# Patient Record
Sex: Male | Born: 1965
Health system: Southern US, Community
[De-identification: ages and names within clinical notes are randomized; demographics above are authoritative.]

## PROBLEM LIST (undated history)

## (undated) DIAGNOSIS — R200 Anesthesia of skin: Secondary | ICD-10-CM

## (undated) DIAGNOSIS — M5126 Other intervertebral disc displacement, lumbar region: Secondary | ICD-10-CM

## (undated) DIAGNOSIS — U071 COVID-19: Secondary | ICD-10-CM

## (undated) DIAGNOSIS — R202 Paresthesia of skin: Secondary | ICD-10-CM

## (undated) DIAGNOSIS — J189 Pneumonia, unspecified organism: Secondary | ICD-10-CM

## (undated) HISTORY — DX: COVID-19: U07.1

## (undated) HISTORY — PX: BACK SURGERY: SHX140

---

## 1999-11-01 ENCOUNTER — Encounter: Admission: RE | Admit: 1999-11-01 | Discharge: 1999-12-13 | Payer: Self-pay | Admitting: *Deleted

## 1999-12-13 ENCOUNTER — Encounter: Admission: RE | Admit: 1999-12-13 | Discharge: 1999-12-13 | Payer: Self-pay | Admitting: *Deleted

## 1999-12-13 ENCOUNTER — Encounter: Payer: Self-pay | Admitting: *Deleted

## 2000-01-10 ENCOUNTER — Ambulatory Visit (HOSPITAL_COMMUNITY): Admission: RE | Admit: 2000-01-10 | Discharge: 2000-01-10 | Payer: Self-pay | Admitting: Neurosurgery

## 2000-01-10 ENCOUNTER — Encounter: Payer: Self-pay | Admitting: Neurosurgery

## 2000-01-24 ENCOUNTER — Encounter: Payer: Self-pay | Admitting: Neurosurgery

## 2000-01-24 ENCOUNTER — Ambulatory Visit (HOSPITAL_COMMUNITY): Admission: RE | Admit: 2000-01-24 | Discharge: 2000-01-24 | Payer: Self-pay | Admitting: Neurosurgery

## 2000-02-07 ENCOUNTER — Ambulatory Visit (HOSPITAL_COMMUNITY): Admission: RE | Admit: 2000-02-07 | Discharge: 2000-02-07 | Payer: Self-pay | Admitting: Neurosurgery

## 2000-02-07 ENCOUNTER — Encounter: Payer: Self-pay | Admitting: Neurosurgery

## 2000-04-02 ENCOUNTER — Encounter: Admission: RE | Admit: 2000-04-02 | Discharge: 2000-05-28 | Payer: Self-pay | Admitting: Neurosurgery

## 2000-12-29 ENCOUNTER — Encounter: Payer: Self-pay | Admitting: Neurosurgery

## 2000-12-29 ENCOUNTER — Ambulatory Visit (HOSPITAL_COMMUNITY): Admission: RE | Admit: 2000-12-29 | Discharge: 2000-12-29 | Payer: Self-pay | Admitting: Neurosurgery

## 2001-01-21 ENCOUNTER — Ambulatory Visit (HOSPITAL_COMMUNITY): Admission: RE | Admit: 2001-01-21 | Discharge: 2001-01-21 | Payer: Self-pay | Admitting: Neurosurgery

## 2001-01-21 ENCOUNTER — Encounter: Payer: Self-pay | Admitting: Neurosurgery

## 2001-02-04 ENCOUNTER — Encounter: Payer: Self-pay | Admitting: Neurosurgery

## 2001-02-04 ENCOUNTER — Ambulatory Visit (HOSPITAL_COMMUNITY): Admission: RE | Admit: 2001-02-04 | Discharge: 2001-02-04 | Payer: Self-pay | Admitting: Neurosurgery

## 2001-05-24 ENCOUNTER — Encounter: Payer: Self-pay | Admitting: Neurosurgery

## 2001-05-24 ENCOUNTER — Inpatient Hospital Stay (HOSPITAL_COMMUNITY): Admission: RE | Admit: 2001-05-24 | Discharge: 2001-05-26 | Payer: Self-pay | Admitting: Neurosurgery

## 2001-10-09 ENCOUNTER — Ambulatory Visit (HOSPITAL_COMMUNITY): Admission: RE | Admit: 2001-10-09 | Discharge: 2001-10-09 | Payer: Self-pay | Admitting: Neurosurgery

## 2001-10-09 ENCOUNTER — Encounter: Payer: Self-pay | Admitting: Neurosurgery

## 2006-08-25 ENCOUNTER — Emergency Department (HOSPITAL_COMMUNITY): Admission: EM | Admit: 2006-08-25 | Discharge: 2006-08-26 | Payer: Self-pay | Admitting: Emergency Medicine

## 2007-04-15 ENCOUNTER — Encounter: Admission: RE | Admit: 2007-04-15 | Discharge: 2007-04-15 | Payer: Self-pay | Admitting: Specialist

## 2007-05-30 ENCOUNTER — Ambulatory Visit: Payer: Self-pay | Admitting: Pulmonary Disease

## 2007-05-30 ENCOUNTER — Inpatient Hospital Stay (HOSPITAL_COMMUNITY): Admission: RE | Admit: 2007-05-30 | Discharge: 2007-06-01 | Payer: Self-pay | Admitting: Specialist

## 2007-06-25 ENCOUNTER — Ambulatory Visit: Payer: Self-pay | Admitting: Pulmonary Disease

## 2007-06-25 LAB — CONVERTED CEMR LAB
Calcium: 9.1 mg/dL (ref 8.4–10.5)
Chloride: 104 meq/L (ref 96–112)
Creatinine, Ser: 1 mg/dL (ref 0.4–1.5)
GFR calc non Af Amer: 87 mL/min
Glucose, Bld: 97 mg/dL (ref 70–99)
Sodium: 136 meq/L (ref 135–145)

## 2007-06-26 ENCOUNTER — Ambulatory Visit: Payer: Self-pay | Admitting: Cardiology

## 2007-08-20 ENCOUNTER — Ambulatory Visit: Payer: Self-pay | Admitting: Pulmonary Disease

## 2007-08-29 ENCOUNTER — Ambulatory Visit: Payer: Self-pay | Admitting: Cardiology

## 2011-04-18 NOTE — Consult Note (Signed)
[name], [name]                ACCOUNT NO.:  [id number]   MEDICAL RECORD NO.:  [id number]          PATIENT TYPE:  INP   LOCATION:  1439                         FACILITY:  [facility]   PHYSICIAN:  Marcellus Scott, MD     DATE OF BIRTH:  10/01/1966   DATE OF CONSULTATION:  05/30/2007  DATE OF DISCHARGE:                                 CONSULTATION   REFERRING PHYSICIAN:  Jene Every, MD.   REASON FOR REFERRAL:  Exertional dyspnea, fevers, abnormal chest x-ray.   CHIEF COMPLAINT:  Back pain, sweats, dyspnea on exertion.   HISTORY OF PRESENT ILLNESS:  The patient is a pleasant, 45 year old  Caucasian male patient with no known past medical history who was  admitted electively yesterday and had a bilateral lateral recess  decompression, foraminotomy L3-4, discectomy L3-4 under general  anesthesia on 05/29/07. Last night the patient spiked a high-grade  temperature of 103.1 per records. This morning the patient has been  having a low-grade temperature, sweating, dyspnea on walking/exertion.  The patient, however, denies any coughing, chest tightness, chest pain,  wheezing, postnasal drip.   PAST MEDICAL HISTORY:  None.   PSYCHIATRIC HISTORY:  None.   PAST SURGICAL HISTORY:  History of back surgery 5 years ago.   ALLERGIES:  PENICILLINS AND VICODIN.   <HOME MEDS/>  1. Darvocet.  2. Flexeril.   FAMILY HISTORY:  Noncontributory.   SOCIAL HISTORY:  The patient is married and has a child. He is a  IT sales professional. Has a 20-pack-year smoking history with no history of  alcohol abuse or drug abuse.   REVIEW OF SYSTEMS:  Headache since 2 days, even prior to the surgery,  some swollen glands in the neck and some throat discomfort. The patient  has consumed diet and tolerated. The patient is passing flatus but has  not passed any stools.   PHYSICAL EXAMINATION:  GENERAL APPEARANCE: The patient is a moderately-  built and nourished male patient, no obvious distress.  VITAL SIGNS;  Temperature is 99.2 degrees Fahrenheit, pulse of 80 per  minute regular, respirations 16 per minute, blood pressure of 95/56,  saturating at 93% on room air.  HEENT: Normocephalic, atraumatic. Pupils equally reacting to light and  accommodation. Oral cavity with multiple missing teeth and some carious  teeth. No oropharyngeal erythema or thrush.  NECK: Without JVD, carotid bruit, lymphadenopathy or goiter. Supple.  RESPIRATORY: With no increased work of breathing. Decreased breath  sounds in the right base but no crackles. There are scattered  bilaterally a few rhonchi but no wheezes.  CARDIOVASCULAR: First and second heart sounds heard, no third or fourth  heart sounds, no murmurs, rubs, gallops or clicks.  ABDOMEN: The abdomen is nondistended, nontender. No organomegaly or  mass. Bowel sounds are normally present.  CENTRAL NERVOUS SYSTEM: The patient is awake, alert, oriented x3 with no  focal neurological deficits.  EXTREMITIES: With no clubbing, cyanosis or edema. Peripheral pulses are  symmetrically felt. The patient has stockings on both lower extremities.  SKIN: The skin is without rashes.  MUSCULOSKELETAL: Dressing over the lumbar surgical site which is clean,  dry and intact.   LABORATORY DATA:  CBC with hemoglobin of 12.5, hematocrit of 36.8, white  blood cells of 14.8, platelet count of 462. INR of 0.9. These were  results from 6/25.   Radiology with a chest x-ray done today which has been reported and  viewed by me. Impression is  1. Cardiac enlargement with vascular congestion. Increasing right      effusion, may suggest some fluid overload.  2. Bibasilar atelectasis is unchanged.  3. Right paratracheal density, may represent prominent vessels or      adenopathy.   ASSESSMENT AND PLAN:  1. Dyspnea. Differential diagnoses include questionable fluid overload      with small right pleural effusion versus aspiration pneumonia      versus atelectasis. Will discontinue IV  fluids. Will give a single      dose of IV Lasix after checking b-type natriuretic peptide. Will      place the patient on IV Avelox. Will obtain blood cultures if      patient spikes temperatures greater than 101. Will also use      incentive spirometry aggressively and for nebulizations p.r.n.      Follow up repeat chest x-ray in the morning.  2. Tobacco abuse. For tobacco cessation counseling. The patient not      keen on using the nicotine patch and says he could quit without it.  3. Febrile illness with leukocytosis, questionable for reactionary      versus questionable pneumonia. Will, however, place on Avelox and      follow patient.      Marcellus Scott, MD  Electronically Signed     AH/MEDQ  D:  05/30/2007  T:  05/30/2007  Job:  [id number]   cc:   Jene Every, M.D.  Fax: [phone]

## 2011-04-18 NOTE — Op Note (Signed)
[name], [name]                ACCOUNT NO.:  [id number]   MEDICAL RECORD NO.:  [id number]          PATIENT TYPE:  AMB   LOCATION:  DAY                          [facility]   PHYSICIAN:  Jene Every, M.D.    DATE OF BIRTH:  Aug 02, 1966   DATE OF PROCEDURE:  05/29/2007  DATE OF DISCHARGE:                               OPERATIVE REPORT   PREOPERATIVE DIAGNOSIS:  Spinal stenosis, herniated nucleus pulposus 3-  4.   POSTOPERATIVE DIAGNOSIS:  Spinal stenosis, herniated nucleus pulposus 3-  4.   PROCEDURE PERFORMED:  Bilateral lateral recess decompression,  foraminotomy L3-4, diskectomy L3-4.   ANESTHESIA:  General.   ASSISTANT:  Roma Schanz, P.A.   BRIEF HISTORY:  The patient 45 year old bilateral lower extremity  radiculopathy mainly right over left, mild scoliosis and CT myelogram  indicating lateral recess stenosis multifactorial left greater than  right and a paracentral disk protrusion at L3-4.  Operative intervention  was indicated decompression of 3 and 4 nerve roots bilaterally.  Risks  and benefits discussed including bleeding, infection, damage to  neurovascular  structures, no change in symptoms, worsening symptoms,  need for repeat decompression, fusion the future, anesthetic  complications etc.   The patient in supine position after induction of adequate anesthesia 1  g Kefzol, he was placed prone on the Wilson frame.  All bony prominences  well padded.  Lumbar is prepped, draped in the usual sterile fashion.  Two 18 gauge spinal needles utilized to localize the 3-4 interspace  confirmed with x-ray.  Incision was made through spinous process 3-4  subcutaneous tissue was dissected.  Electrocautery utilized to achieve  hemostasis.  Dorsal lumbar fascia identified divided on either line of  the skin incision preserving the interspinous ligament.  McCullough  retractor was placed.  Confirmatory radiographs obtained with Penfield 4  in the interlaminar  space.  First attention was turned towards the disk  herniated side on the left.  Hemilaminotomy at the caudad edge of 3 was  performed with 2 and 3 mm Kerrison.  Soft bone was noted.  He does have  a history of smoking.  Removed ligamentum flavum and the hemilaminotomy  was performed with the cephalad edge of 4 with a 2 mm Kerrison,  decompressing the lateral recess.  It was fairly hypertrophic ligamentum  flavum and vascular engorged venous plexus all compressing the S4 root.  This cauterized and decompressed.  There was a focal paracentral disk  protrusion.  The neural elements were protected with a D'Errico and  annulotomy performed.  Copious portion of disk material was removed from  the disk space with straight and upbiting pituitary fully decompressing  the 4 root.  We inspected.  No evidence of the disk herniation beneath  thecal sac.  A hockey stick probe passed freely in the foramen of three  and four, no evidence of CSF leaks or active bleeding.  Copiously  irrigated disk space antibiotic irrigation, placed Gelfoam in laminotomy  defect after bone wax placed on the cancellous surfaces.  Attention was  turned toward the right.  With rotation of the spine  due to scoliosis  there was a small interlaminar window.  This was opened with 2 mm  Kerrison.  There was lateral recess stenosis noted here.  The vascular  plexus was cauterized.  To decompress lateral recess, performed a  foraminotomy of L3, decompressed three root out into the foramen.  No  disk herniation on the side was noted.  After the foraminotomy, hockey-  stick probe passed freely over foramen of 3 and 4 underneath thecal sac.  This no residual compression, no CSF leakage or active bleeding.  Wound  copiously irrigated.  Bone wax on the cancellous surfaces.  Thrombin-  soaked Gelfoam in the interlaminar space.  Final x-ray was obtained with  a Penfield four in the interlaminar space  3-4.  Cobra retractors  removed.   Paraspinous muscles inspected with no active bleeding, was  copiously irrigated.  Dorsolumbar fascia reapproximated #1 Vicryl  interrupted figure-of-eight suture.  Subcutaneous tissue reapproximated  2-0 Vicryl simple sutures.  The skin was reapproximated staples.  Wound  was dressed sterilely.  Placed supine on hospital bed, extubated without  difficulty transported to the recovery room in satisfactory condition.   The patient tolerated procedure well, no complications.      Jene Every, M.D.  Electronically Signed     JB/MEDQ  D:  05/29/2007  T:  05/29/2007  Job:  [id number]

## 2011-04-21 NOTE — Op Note (Signed)
[place]. [facility]  Patient:    KOUPER, SPINELLA                       MRN: [id number] Proc. Date: 05/24/01 Adm. Date:  [id number] Disc. Date: [id number] Attending:  Danella Penton                           Operative Report  PREOPERATIVE DIAGNOSIS:   Chronic L5-S1 radiculopathy secondary to spondylosis.  POSTOPERATIVE DIAGNOSIS:  Chronic L5-S1 radiculopathy secondary to spondylosis.  OPERATION:  Right L5 hemilaminectomy.  Foraminotomy L4-L5, L5-S1.  Lysis of adhesions.  Microscope.  SURGEON:  Tanya Nones. Jeral Fruit, M.D.  ASSISTANTMena Goes. Franky Macho, M.D.  INDICATIONS:  The patient has been seen by me on several occasions in my office for almost 18 months complaining of back and bilateral leg pain. Lately the pain is getting worse.  X-rays showed that indeed he has degenerative disk disease at the level of L4-L5 and L5-S1.  There was stenosis.  Surgery was advised.  The patient complained of no pain in the left leg.  The patient knew of the risks such as explained in the history and physical.  DESCRIPTION OF PROCEDURE:   The patient was taken to the operating room and placed on the appropriate monitors.  The back was prepped with Betadine.  A midline incision of L5-S1 was made on the right side.  Muscle was retracted on the right side.  By x-ray, we were at the level of L5-S1.  Then using the drill, we drilled the hemilamina of L5.  We brought the microscope into the area and what we found was calcified yellow ligament with quite a bit of spondylosis and narrowing at the level of the foraminotomy.  The foraminotomy was decompressed showing that the L5-S1 nerve root was encased.  Investigation at the level of L4-L5 showed there was a bulging disk but there was no need to do any diskectomy.  Having completed the decompression, the area was irrigated.  Fentanyl was left in the dural space.  _____ _____ was also cut to cover the dura mater.  The wound  was closed with Vicryl and Steri-Strips. The patient did well. DD:  05/24/01 TD:  05/26/01 Job: [id number]

## 2011-04-21 NOTE — H&P (Signed)
[place]. [facility]  Patient:    ZENON, LEAF                       MRN: [id number] Adm. Date:  [id number] Attending:  Danella Penton                         History and Physical  HISTORY OF PRESENT ILLNESS:  Mr. Vespa is a gentleman who has been seen by me for more than a year.  He came in January 2001, complaining of back pain with radiation down to the right leg.  This patient has tried conservative treatment, but he did not get any better.  We did some x-rays which indeed showed that he has degenerative disk disease, as well as herniated disk at L4-5 with stenosis at the level of 5-1.  The patient declined surgery, but nevertheless he continued to get worse.  He had been seen by several physicians.  He is not any better.  Finally, he decided that since the epidural injection has not helped him and he is getting worse that he wants to proceed with surgery.  He denies any pain in the left leg.  PAST MEDICAL HISTORY:  Negative.  ALLERGIES:  He is not allergic to any medications.  SOCIAL HISTORY:  He drinks socially and he smokes a pack a day.  FAMILY HISTORY:  Unremarkable.  REVIEW OF SYSTEMS:  Negative, except for back and right leg pain.  PHYSICAL EXAMINATION:  HEAD/NOSE/THROAT:  Normal.  NECK:  He is able to flex generalized with no problem.  LUNGS:  Clear.  HEART:  Heart sounds are normal.  EXTREMITIES:  Normal, with normal pulses.  NEUROLOGICAL:  Mental status normal.  Cranial nerves normal.  Sensation is grossly normal to pinprick, light touch, and vibration.  His strength showed that he has some weakness of the thenar muscles in both hands.  Also, in the lower extremities he has a weakened dorsiflexion and plantar flexion.  There is no atrophy or fasciculation.  Reflexes are 1+.  No Babinski.  MUSCULOSKELETAL:  Straight-leg raising of the right side is positive at 60 degrees.  Left side is negative.  He had difficulty  walking on tip-toes and heels.  LABORATORY DATA:  The x-rays showed that, indeed, at the level of L4-5 he has degenerative disk disease with stenosis affecting the 5-1.  CLINICAL IMPRESSION:  Chronic L5-S1 radiculopathy secondary to degenerative disk disease and foraminal stenosis.  RECOMMENDATIONS:  The patient wants to proceed with surgery.  The procedure will be a right L5 hemilaminectomy with foraminotomy to the decompress the L5-S1 nerve root.  Decision about diskectomy will be made during surgery. The patient knows of the risks such as infection, CSF leak, worsening of the pain, paralysis, and need of further surgery which might include fusion. DD:  05/24/01 TD:  05/25/01 Job: [id number]

## 2011-04-21 NOTE — Discharge Summary (Signed)
[place]. [facility]  Patient:    [name], [name]                       MRN: [id number] Adm. Date:  [id number] Disc. Date: [id number] Attending:  Danella Penton                           Discharge Summary  ADMISSION DIAGNOSIS:  Lumbar spondylosis with lumbar radiculopathy.  DISCHARGE DIAGNOSIS:  Lumbar spondylosis with lumbar radiculopathy.  OPERATIONS:  Lumbar hemilaminectomy at L5 and facetectomy at L4-5 and L5-S1 on the right.  CONDITION ON DISCHARGE:  Improved.  HOSPITAL COURSE:  The patient is a 45 year old individual who has had significant back and right lower extremity pain.  He underwent hemilaminectomy on the right side at L4-5 and L5-S1 for spondylitic disease.  Postoperatively, he complained of significant back pain and was slow to mobilize.  He used a PCA pump for the first 24 hours and then was weaned to oral Percocet.  DISCHARGE MEDICATIONS:  He is also using some Valium for muscle spasm.  He will be given a prescription for Percocet, #40 without refills, and Ativan 0.5 mg, #40 without refills, for muscle spasm. DD:  05/26/01 TD:  05/27/01 Job: [id number]

## 2011-04-21 NOTE — Discharge Summary (Signed)
NAMEMARTEZ, Louis                ACCOUNT NO.:  [id number]   MEDICAL RECORD NO.:  [id number]          PATIENT TYPE:  INP   LOCATION:  1612                         FACILITY:  [facility]   PHYSICIAN:  Jene Every, M.D.    DATE OF BIRTH:  03-30-1966   DATE OF ADMISSION:  05/29/2007  DATE OF DISCHARGE:  06/01/2007                               DISCHARGE SUMMARY   ADMISSION DIAGNOSES:  1. Herniated nucleus pulposus, spinal stenosis, L3-4 bilaterally.  2. A 20-pack-plus-year smoker.   DISCHARGE DIAGNOSES:  1. Status post bilateral lumbar decompression, L3-4.  2. Questionable aspiration pneumonia versus lung lesion being      evaluated by pulmonology.  Follow up on an outpatient basis.   HISTORY:  [name] is a 45 year old gentleman who sustained a work-  related injury where he developed low back and bilateral lower extremity  pain.  Patient was initially treated conservatively with medications,  physical therapy as well as injections, but failed conservative  treatment.  Studies do show stenosis as well as a small disk bulge at 3-  4.  It is felt at this point that he would benefit from a bilateral  lumbar decompression.  The risks and benefits of this were discussed  with the patient.  He did elect to proceed.   PROCEDURE NOTE:  Patient was taken to the OR on May 29, 2007 to undergo  bilateral decompression at L3-4.   SURGEON:  Jene Every, M.D.   ASSISTANT:  Roma Schanz, P.A.   ANESTHESIA:  General.   COMPLICATIONS:  None.   CONSULTS:  PT/OT, [facility], critical care team.   LABORATORY DATA:  Preoperative CBC showed hemoglobin 13.4, hematocrit  38.3.  These were followed throughout the hospital course.  Patient did  have an elevation in his white cell count to a highest of 15.9.  At the  time of discharge, it had decreased to a level of 13.4.  Hemoglobin  remained stable.  At discharge, hemoglobin was 12.3, hematocrit 35.5.  Platelets were slightly elevated at 460.   Coagulation studies were done  preoperatively within normal range.  Electrolytes preoperatively and  postoperatively all were within normal range as well.  BNP taken  postoperatively was 54.8.  Blood type is O+.  Respiratory cultures were  taken.  Preliminary studies showed no WBC.  Rare squamous epithelial  cells present.  Rare gram positive rods were noted.  Legionella urine  culture is pending.  AFB culture and smear was taken.  To be re-examined  at six weeks following initial sampling.  EKG showed normal sinus  rhythm.  Normal EKG was noted.  No preoperative chest x-ray was taken;  however, postoperatively, the patient developed a high grade  temperature.  A chest x-ray was ordered which showed streaking opacity  in the lung bases, likely due to atelectasis, although developing an  infection cannot be excluded.  Repeat chest x-ray was taken the  following day down in the radiology department, which showed cardiac  enlargement with vascular congestion, increasing right effusion was  noted with questionable fluid overload.  Bibasilar atelectasis was  unchanged.  There was also noted a right paratracheal density with  possible prominent vessels versus adenopathy.   Chest CT was ordered, rendered abnormal lung finding which showed  elevated right hemidiaphragm with right lower lobe atelectasis.  There  is some air space opacity noted in the posterior right lower lobe.  Pneumonia could not be excluded.  There is a 3.8 cm rounded opacity of  the medial right upper lobe which abuts the mediastinum and contains a  central air/fluid level.  This is suspicious for cavitary pneumonia,  questioned secondary to aspiration.  They recommended a followup CT  following treatment.  There is no evidence __________ obstructing mass.  Also noted mild left lower lobe atelectasis as well.   HOSPITAL COURSE:  Patient was admitted, taken to the OR, and underwent  the above-stated procedure without  difficulty.  He is then transferred  to the PACU and then to the orthopedic floor for postoperative care.  Unfortunately, on postoperative night #1, the patient spiked 103.1  temperature.  The PA was called.  No orders were received.  On  postoperative day #1, the patient was re-evaluated.  He was feeling much  better.  The pain in his back was currently controlled.  He was voiding.  He had not been out of bed.  He denied any chest pain, shortness of  breath, or nausea.  He did note a nonproductive cough.  Temperature in  the a.m. of 99.2.   Portable chest x-ray taken overnight did show bibasilar atelectasis,  questionable pneumonia.   White cell count was slightly elevated at 14.8.  We did repeat a chest x-  ray done in the radiology department at 12 noon.  In the interim, we  encouraged incentive spirometer.  The patient was admitted for further  evaluation.   Following the second chest x-ray, which was significantly worse from the  night before, we did consult [facility], who did place the patient on  empiric Avelox as well as aggressive pulmonary toiletry.  The patient  does have a previous history of tobacco abuse.   On postoperative day #2, the patient orthopedically was doing fairly  well but continued to spike low grade temperatures.  White cell count  remained elevated at 15.9.  Avelox incentive spirometer was encouraged.  [facility] continued to work out the reason for his febrile state as well  as a chest x-ray.  They did consult pulmonology, who evaluated the  patient, at which point they recommended a CT scan.  Dr. Shelle Iron did  evaluate the patient and recommended continued treatment with the Avelox  for the pneumonia for 14 days.  It is questionable if there was an  opacity in the lung, which could be malignant in nature.  He however  recommended no immediate treatment, just to follow up in the office for  a repeat CT/chest x-ray following treatment for the pneumonia.    Otherwise, the patient was doing well.  Orthopedically, he progressed  well with physical therapy.  Fevers did resolve.  He did note  significant improvement with taking the antibiotic.  White cell counts  were slowly trending downward.  Cultures of the sputum were taken and  were pending.   DISPOSITION:  On postoperative day #3, it is felt the patient was stable  to be discharged home.  He is to follow up with Dr. Shelle Iron in  approximately 10-14 days for suture removal.  He is to follow up with  Dr. Shelle Iron in 1-2 weeks for further evaluation  of his lung findings.   ACTIVITY:  He is to progress activity slowly.  Okay to walk up the  stairs.  No lifting for six weeks.  No driving for two weeks.  No  sexually active for 2-4 weeks.   INCISION:  He is to keep this clean and dry.  Change it daily.  It is  okay for him to shower in 72 hours.   DISCHARGE MEDICATIONS:  1. Vitamin C 500 mg daily.  2. Darvocet-N 100 1-2 p.o. q.4-6h. p.r.n. pain.  3. Avelox 400 mg 1 daily x2 weeks.  4. He is also placed on clindamycin 300 mg four times a day.   He was advised to go to the emergency room to have a TB test checked.  We encouraged no smoking.  He is to continue to use his incentive  spirometer several times a day.  If he develops high grade fever or  shortness of breath, he is to present to the emergency room immediately.   CONDITION ON DISCHARGE:  Stable.   FINAL DIAGNOSES:  Status post lumbar decompression, L3-4, with new onset  pneumonia versus pulmonary lesion, which will be followed up by Dr.  Shelle Iron on an outpatient basis.      Roma Schanz, P.A.      Jene Every, M.D.  Electronically Signed    CS/MEDQ  D:  07/05/2007  T:  07/05/2007  Job:  [id number]

## 2011-09-20 LAB — BASIC METABOLIC PANEL
BUN: 6
BUN: 8
CO2: 27
Calcium: 8.5
Calcium: 8.7
Chloride: 107
Creatinine, Ser: 0.88
GFR calc non Af Amer: >60
Glucose, Bld: 101 — ABNORMAL HIGH
Glucose, Bld: 115 — ABNORMAL HIGH
Sodium: 137

## 2011-09-20 LAB — CBC
HCT: 36.8 — ABNORMAL LOW
Hemoglobin: 12.3 — ABNORMAL LOW
Hemoglobin: 12.5 — ABNORMAL LOW
MCHC: 34
MCHC: 34.6
MCV: 89.8
MCV: 90.2
MCV: 90.7
Platelets: 416 — ABNORMAL HIGH
Platelets: 426 — ABNORMAL HIGH
RBC: 3.89 — ABNORMAL LOW
RBC: 3.92 — ABNORMAL LOW
RBC: 4.1 — ABNORMAL LOW
RDW: 11.4 — ABNORMAL LOW
RDW: 11.7
RDW: 12.1
WBC: 15.9 — ABNORMAL HIGH

## 2011-09-20 LAB — AFB CULTURE WITH SMEAR (NOT AT ARMC)
Acid Fast Smear: NONE SEEN
Acid Fast Smear: NONE SEEN

## 2011-09-20 LAB — CULTURE, RESPIRATORY W GRAM STAIN

## 2011-09-20 LAB — LEGIONELLA ANTIGEN, URINE

## 2011-09-20 LAB — HEMOGLOBIN AND HEMATOCRIT, BLOOD: Hemoglobin: 13.4

## 2011-09-20 LAB — PROTIME-INR
INR: 0.9
Prothrombin Time: 12.4

## 2011-09-20 LAB — APTT: aPTT: 32

## 2013-04-19 ENCOUNTER — Emergency Department (HOSPITAL_COMMUNITY)
Admission: EM | Admit: 2013-04-19 | Discharge: 2013-04-19 | Disposition: A | Discharge status: Home / Self Care | Payer: Worker's Compensation | Attending: Emergency Medicine | Admitting: Emergency Medicine

## 2013-04-19 ENCOUNTER — Emergency Department (HOSPITAL_COMMUNITY): Payer: Worker's Compensation

## 2013-04-19 ENCOUNTER — Encounter (HOSPITAL_COMMUNITY): Payer: Self-pay | Admitting: *Deleted

## 2013-04-19 DIAGNOSIS — W108XXA Fall (on) (from) other stairs and steps, initial encounter: Secondary | ICD-10-CM | POA: Insufficient documentation

## 2013-04-19 DIAGNOSIS — S81009A Unspecified open wound, unspecified knee, initial encounter: Secondary | ICD-10-CM | POA: Insufficient documentation

## 2013-04-19 DIAGNOSIS — Y9301 Activity, walking, marching and hiking: Secondary | ICD-10-CM | POA: Insufficient documentation

## 2013-04-19 DIAGNOSIS — W1809XA Striking against other object with subsequent fall, initial encounter: Secondary | ICD-10-CM | POA: Insufficient documentation

## 2013-04-19 DIAGNOSIS — Z880 Allergy status to penicillin: Secondary | ICD-10-CM | POA: Insufficient documentation

## 2013-04-19 DIAGNOSIS — F17200 Nicotine dependence, unspecified, uncomplicated: Secondary | ICD-10-CM | POA: Insufficient documentation

## 2013-04-19 DIAGNOSIS — Y9289 Other specified places as the place of occurrence of the external cause: Secondary | ICD-10-CM | POA: Insufficient documentation

## 2013-04-19 DIAGNOSIS — S82002A Unspecified fracture of left patella, initial encounter for closed fracture: Secondary | ICD-10-CM

## 2013-04-19 DIAGNOSIS — S82009A Unspecified fracture of unspecified patella, initial encounter for closed fracture: Secondary | ICD-10-CM | POA: Insufficient documentation

## 2013-04-19 LAB — POCT I-STAT, CHEM 8
BUN: 10 mg/dL (ref 6–23)
Chloride: 106 mEq/L (ref 96–112)
Creatinine, Ser: 0.9 mg/dL (ref 0.50–1.35)
Glucose, Bld: 105 mg/dL — ABNORMAL HIGH (ref 70–99)
Potassium: 3.9 mEq/L (ref 3.5–5.1)

## 2013-04-19 LAB — CBC WITH DIFFERENTIAL/PLATELET
Eosinophils Absolute: 0.1 10*3/uL (ref 0.0–0.7)
Hemoglobin: 15.3 g/dL (ref 13.0–17.0)
Lymphocytes Relative: 16 % (ref 12–46)
Lymphs Abs: 2.4 10*3/uL (ref 0.7–4.0)
MCH: 31.9 pg (ref 26.0–34.0)
Monocytes Relative: 7 % (ref 3–12)
Neutro Abs: 12 10*3/uL — ABNORMAL HIGH (ref 1.7–7.7)
Neutrophils Relative %: 77 % (ref 43–77)
RBC: 4.79 MIL/uL (ref 4.22–5.81)

## 2013-04-19 MED ORDER — HYDROMORPHONE HCL PF 1 MG/ML IJ SOLN
1.0000 mg | Freq: Once | INTRAMUSCULAR | Status: AC
  Administered 2013-04-19: 1 mg via INTRAVENOUS
  Filled 2013-04-19: qty 1

## 2013-04-19 MED ORDER — HYDROMORPHONE HCL 2 MG PO TABS: 2.0000 mg | ORAL_TABLET | ORAL | Status: DC | PRN

## 2013-04-19 NOTE — ED Provider Notes (Signed)
History     CSN: [id number]  Arrival date & time 04/19/13  1421   First MD Initiated Contact with Patient 04/19/13 1424      Chief Complaint  Patient presents with  . Knee Injury    (Consider location/radiation/quality/duration/timing/severity/associated sxs/prior treatment) The history is provided by the patient.   patient was walking upstairs and fell, striking his left knee on the brick stairs. He states he saw his knee In pieces and out of place. No other injury. No chest pain. No Abdominal pain. He does have an abrasion to the knee. He has not eaten today. He denies medical problems except for previous back surgery.  History reviewed. No pertinent past medical history.  Past Surgical History  Procedure Laterality Date  . Back surgery      No family history on file.  History  Substance Use Topics  . Smoking status: Current Every Day Smoker    Types: Cigarettes  . Smokeless tobacco: Not on file  . Alcohol Use: No      Review of Systems  Constitutional: Negative for chills, diaphoresis and fatigue.  Musculoskeletal: Positive for joint swelling and gait problem. Negative for back pain.  Skin: Positive for wound. Negative for pallor and rash.  Hematological: Negative for adenopathy.    Allergies  Penicillins  Home Medications   Current Outpatient Rx  Name  Route  Sig  Dispense  Refill  . HYDROmorphone (DILAUDID) 2 MG tablet   Oral   Take 1 tablet (2 mg total) by mouth every 4 (four) hours as needed for pain.   20 tablet   0     BP 122/73  Pulse 85  Temp(Src) 98.4 F (36.9 C) (Oral)  Resp 16  SpO2 98%  Physical Exam  Constitutional: He is oriented to person, place, and time. He appears well-developed and well-nourished.  HENT:  Head: Normocephalic.  Eyes: Conjunctivae are normal. Pupils are equal, round, and reactive to light.  Neck: Normal range of motion. Neck supple.  Cardiovascular: Normal rate and regular rhythm.   Pulmonary/Chest: Effort  normal and breath sounds normal.  Abdominal: Soft.  Musculoskeletal: He exhibits tenderness.  2 small puncture wounds to left anterior knee. Palpable patella in multiple pieces. Knee effusion and hematoma. Patient will not straighten leg and upper leg is raised. Neurovascular intact distally.  Neurological: He is alert and oriented to person, place, and time.    ED Course  Procedures (including critical care time)  Labs Reviewed  CBC WITH DIFFERENTIAL - Abnormal; Notable for the following:    WBC 15.5 (*)    Neutro Abs 12.0 (*)    All other components within normal limits  POCT I-STAT, CHEM 8 - Abnormal; Notable for the following:    Glucose, Bld 105 (*)    All other components within normal limits   Dg Chest 2 View  04/19/2013   *RADIOLOGY REPORT*  Clinical Data: Fall with dizziness.  Patellar fracture.  CHEST - 2 VIEW  Comparison: CT 08/29/2007 plain films 05/31/2007.  Findings: Lateral view degraded by patient arm position.  Midline trachea.  Mild cardiomegaly, accentuated by low lung volumes on the frontal. No pleural effusion or pneumothorax. Diffuse peribronchial thickening.  Clear lungs.  IMPRESSION: 1.  No acute cardiopulmonary disease. 2.  Mild peribronchial thickening which may relate to chronic bronchitis or smoking.   Original Report Authenticated By: Jeronimo Greaves, M.D.   Dg Knee Complete 4 Views Left  04/19/2013   *RADIOLOGY REPORT*  Clinical Data: Trauma.  Pain.  LEFT KNEE - COMPLETE 4+ VIEW  Comparison: None.  Findings: There is a comminuted fracture of the patella with mild overlying soft tissue swelling.  A small suprapatellar joint effusion.  Mild medial compartment osteoarthritis.  IMPRESSION: Comminuted patellar fracture with soft tissue swelling and small suprapatellar joint effusion.   Original Report Authenticated By: Jeronimo Greaves, M.D.     1. Patella fracture, left, closed, initial encounter       MDM  Patient with patellar fracture. Appears to be closed. After  discussion with Dr. Magnus Ivan from orthopedic surgery the patient be seen in the office on Monday. He was given a knee immobilizer and crutches. He will require surgery and was given preop labs EKG x-ray here.        Juliet Rude. Rubin Payor, MD 04/19/13 [phone]

## 2013-04-19 NOTE — Progress Notes (Signed)
Orthopedic Tech Progress Note Patient Details:  [name] Apr 22, 1966 [id number]  Ortho Devices Type of Ortho Device: Knee Immobilizer;Crutches Ortho Device/Splint Location: LLE Ortho Device/Splint Interventions: Ordered;Adjustment;Application   [name] 04/19/2013, 4:00 PM

## 2013-04-19 NOTE — ED Notes (Signed)
Pt states that he was walking up brick steps when he fell. Pt states that his patella was in a different location and "3 different pieces". Pt denies LOC. Pt received fentynyl, 30 mg of Toradol, and 4 mg of zofran. Pt had applied ice and EMS has pt in splint

## 2013-04-19 NOTE — ED Notes (Addendum)
Pt states that he was seen recently for sinus infection, pneumonia, vertigo.  Pt states the meds he was taking for that which include an antibiotic, prednisone, and nasal spray he is no longer taking.  Pt also states that his doctor told him his liver enzymes were low.

## 2013-04-23 ENCOUNTER — Other Ambulatory Visit (HOSPITAL_COMMUNITY): Payer: Self-pay | Admitting: Orthopaedic Surgery

## 2013-04-24 ENCOUNTER — Encounter (HOSPITAL_COMMUNITY)
Admission: RE | Admit: 2013-04-24 | Discharge: 2013-04-24 | Disposition: A | Discharge status: Home / Self Care | Payer: Self-pay | Source: Ambulatory Visit | Attending: Orthopaedic Surgery | Admitting: Orthopaedic Surgery

## 2013-04-24 ENCOUNTER — Encounter (HOSPITAL_COMMUNITY): Payer: Self-pay

## 2013-04-24 HISTORY — DX: Anesthesia of skin: R20.0

## 2013-04-24 HISTORY — DX: Pneumonia, unspecified organism: J18.9

## 2013-04-24 HISTORY — DX: Other intervertebral disc displacement, lumbar region: M51.26

## 2013-04-24 HISTORY — DX: Paresthesia of skin: R20.2

## 2013-04-24 LAB — CBC
HCT: 41.9 % (ref 39.0–52.0)
Hemoglobin: 14.8 g/dL (ref 13.0–17.0)
MCV: 90.7 fL (ref 78.0–100.0)
RBC: 4.62 MIL/uL (ref 4.22–5.81)
WBC: 13.5 10*3/uL — ABNORMAL HIGH (ref 4.0–10.5)

## 2013-04-24 NOTE — Progress Notes (Signed)
Pt denies SOB, chest pain, and being under the care of a cardiologist.  

## 2013-04-24 NOTE — Pre-Procedure Instructions (Signed)
[name]  04/24/2013   Your procedure is scheduled on:  Saturday, Apr 26, 2013  Report to [facility] at  5:30 AM.  Call this number if you have problems the morning of surgery: [phone]   Remember:   Do not eat food or drink liquids after midnight.   Take these medicines the morning of surgery with A SIP OF WATER: HYDROmorphone (DILAUDID) 2 MG tablet Stop taking Aspirin, and herbal medications. Do not take any NSAIDs ie: Ibuprofen, Advil, Naproxen or any medication containing Aspirin.             Do not wear jewelry, make-up or nail polish.  Do not wear lotions, powders, or perfumes. You may wear deodorant.  Do not shave 48 hours prior to surgery. Men may shave face and neck.  Do not bring valuables to the hospital.  Contacts, dentures or bridgework may not be worn into surgery.  Leave suitcase in the car. After surgery it may be brought to your room.  For patients admitted to the hospital, checkout time is 11:00 AM the day of discharge.   Patients discharged the day of surgery will not be allowed to drive home.  Name and phone number of your driver:   Special Instructions: Shower using CHG 2 nights before surgery and the night before surgery.  If you shower the day of surgery use CHG.  Use special wash - you have one bottle of CHG for all showers.  You should use approximately 1/3 of the bottle for each shower.   Please read over the following fact sheets that you were given: Pain Booklet, Coughing and Deep Breathing and Surgical Site Infection Prevention

## 2013-04-25 MED ORDER — CLINDAMYCIN PHOSPHATE 900 MG/50ML IV SOLN
900.0000 mg | INTRAVENOUS | Status: AC
  Administered 2013-04-26: 900 mg via INTRAVENOUS
  Filled 2013-04-25: qty 50

## 2013-04-26 ENCOUNTER — Encounter (HOSPITAL_COMMUNITY): Payer: Self-pay | Admitting: *Deleted

## 2013-04-26 ENCOUNTER — Observation Stay (HOSPITAL_COMMUNITY)
Admission: RE | Admit: 2013-04-26 | Discharge: 2013-04-27 | Disposition: A | Discharge status: Home / Self Care | Payer: Self-pay | Source: Ambulatory Visit | Attending: Orthopaedic Surgery | Admitting: Orthopaedic Surgery

## 2013-04-26 ENCOUNTER — Encounter (HOSPITAL_COMMUNITY): Payer: Self-pay | Admitting: Anesthesiology

## 2013-04-26 ENCOUNTER — Encounter (HOSPITAL_COMMUNITY)
Admission: RE | Disposition: A | Discharge status: Home / Self Care | Payer: Self-pay | Source: Ambulatory Visit | Attending: Orthopaedic Surgery

## 2013-04-26 ENCOUNTER — Ambulatory Visit (HOSPITAL_COMMUNITY): Payer: Self-pay | Admitting: Anesthesiology

## 2013-04-26 DIAGNOSIS — S82009A Unspecified fracture of unspecified patella, initial encounter for closed fracture: Principal | ICD-10-CM | POA: Insufficient documentation

## 2013-04-26 DIAGNOSIS — W108XXA Fall (on) (from) other stairs and steps, initial encounter: Secondary | ICD-10-CM | POA: Insufficient documentation

## 2013-04-26 DIAGNOSIS — S82001A Unspecified fracture of right patella, initial encounter for closed fracture: Secondary | ICD-10-CM

## 2013-04-26 HISTORY — PX: ORIF PATELLA: SHX5033

## 2013-04-26 SURGERY — OPEN REDUCTION INTERNAL FIXATION (ORIF) PATELLA
Anesthesia: General | Site: Knee | Laterality: Left | Wound class: Clean

## 2013-04-26 MED ORDER — HYDROMORPHONE HCL PF 1 MG/ML IJ SOLN
INTRAMUSCULAR | Status: AC
Start: 2013-04-26 — End: 2013-04-26
  Filled 2013-04-26: qty 1

## 2013-04-26 MED ORDER — ARTIFICIAL TEARS OP OINT
TOPICAL_OINTMENT | OPHTHALMIC | Status: DC | PRN
  Administered 2013-04-26: 1 via OPHTHALMIC

## 2013-04-26 MED ORDER — FENTANYL CITRATE 0.05 MG/ML IJ SOLN
INTRAMUSCULAR | Status: DC | PRN
  Administered 2013-04-26: 25 ug via INTRAVENOUS
  Administered 2013-04-26 (×2): 50 ug via INTRAVENOUS
  Administered 2013-04-26 (×2): 25 ug via INTRAVENOUS
  Administered 2013-04-26: 50 ug via INTRAVENOUS
  Administered 2013-04-26: 25 ug via INTRAVENOUS
  Administered 2013-04-26: 50 ug via INTRAVENOUS

## 2013-04-26 MED ORDER — POLYETHYLENE GLYCOL 3350 17 G PO PACK: 17.0000 g | PACK | Freq: Every day | ORAL | Status: DC | PRN

## 2013-04-26 MED ORDER — MORPHINE SULFATE 2 MG/ML IJ SOLN
1.0000 mg | INTRAMUSCULAR | Status: DC | PRN
  Administered 2013-04-26 (×2): 1 mg via INTRAVENOUS

## 2013-04-26 MED ORDER — PROPOFOL 10 MG/ML IV BOLUS
INTRAVENOUS | Status: DC | PRN
  Administered 2013-04-26: 200 mg via INTRAVENOUS

## 2013-04-26 MED ORDER — HYDROCODONE-ACETAMINOPHEN 5-325 MG PO TABS: 1.0000 | ORAL_TABLET | ORAL | Status: DC | PRN

## 2013-04-26 MED ORDER — ONDANSETRON HCL 4 MG PO TABS: 4.0000 mg | ORAL_TABLET | Freq: Four times a day (QID) | ORAL | Status: DC | PRN

## 2013-04-26 MED ORDER — KETOROLAC TROMETHAMINE 15 MG/ML IJ SOLN
15.0000 mg | Freq: Four times a day (QID) | INTRAMUSCULAR | Status: DC | PRN
  Administered 2013-04-26: 15 mg via INTRAVENOUS
  Filled 2013-04-26: qty 1

## 2013-04-26 MED ORDER — PROMETHAZINE HCL 25 MG/ML IJ SOLN: 6.2500 mg | INTRAMUSCULAR | Status: DC | PRN

## 2013-04-26 MED ORDER — BISACODYL 5 MG PO TBEC: 5.0000 mg | DELAYED_RELEASE_TABLET | Freq: Every day | ORAL | Status: DC | PRN

## 2013-04-26 MED ORDER — LIDOCAINE HCL (CARDIAC) 20 MG/ML IV SOLN
INTRAVENOUS | Status: DC | PRN
  Administered 2013-04-26: 80 mg via INTRAVENOUS

## 2013-04-26 MED ORDER — DOCUSATE SODIUM 100 MG PO CAPS
100.0000 mg | ORAL_CAPSULE | Freq: Two times a day (BID) | ORAL | Status: DC
  Administered 2013-04-26 – 2013-04-27 (×3): 100 mg via ORAL
  Filled 2013-04-26 (×3): qty 1

## 2013-04-26 MED ORDER — OXYCODONE HCL 5 MG PO TABS
5.0000 mg | ORAL_TABLET | Freq: Once | ORAL | Status: AC | PRN
  Administered 2013-04-26: 5 mg via ORAL

## 2013-04-26 MED ORDER — METOCLOPRAMIDE HCL 10 MG PO TABS: 5.0000 mg | ORAL_TABLET | Freq: Three times a day (TID) | ORAL | Status: DC | PRN

## 2013-04-26 MED ORDER — KETOROLAC TROMETHAMINE 30 MG/ML IJ SOLN
INTRAMUSCULAR | Status: AC
  Filled 2013-04-26: qty 1

## 2013-04-26 MED ORDER — OXYCODONE HCL 5 MG PO TABS
ORAL_TABLET | ORAL | Status: AC
  Filled 2013-04-26: qty 1

## 2013-04-26 MED ORDER — OXYCODONE HCL ER 10 MG PO T12A
20.0000 mg | EXTENDED_RELEASE_TABLET | Freq: Two times a day (BID) | ORAL | Status: DC
  Administered 2013-04-26 – 2013-04-27 (×3): 20 mg via ORAL
  Filled 2013-04-26 (×2): qty 1
  Filled 2013-04-26: qty 2
  Filled 2013-04-26: qty 1

## 2013-04-26 MED ORDER — HYDROMORPHONE HCL PF 1 MG/ML IJ SOLN
INTRAMUSCULAR | Status: AC
  Filled 2013-04-26: qty 1

## 2013-04-26 MED ORDER — METHOCARBAMOL 500 MG PO TABS
500.0000 mg | ORAL_TABLET | Freq: Four times a day (QID) | ORAL | Status: DC | PRN
  Administered 2013-04-26 – 2013-04-27 (×3): 500 mg via ORAL
  Filled 2013-04-26 (×4): qty 1

## 2013-04-26 MED ORDER — HYDROMORPHONE HCL PF 1 MG/ML IJ SOLN
0.2500 mg | INTRAMUSCULAR | Status: DC | PRN
  Administered 2013-04-26 (×4): 0.5 mg via INTRAVENOUS

## 2013-04-26 MED ORDER — OXYCODONE HCL 5 MG/5ML PO SOLN: 5.0000 mg | Freq: Once | ORAL | Status: AC | PRN

## 2013-04-26 MED ORDER — LACTATED RINGERS IV SOLN
INTRAVENOUS | Status: DC | PRN
  Administered 2013-04-26: 07:00:00 via INTRAVENOUS

## 2013-04-26 MED ORDER — ACETAMINOPHEN 10 MG/ML IV SOLN
1000.0000 mg | Freq: Once | INTRAVENOUS | Status: AC
  Administered 2013-04-26: 1000 mg via INTRAVENOUS
  Filled 2013-04-26: qty 100

## 2013-04-26 MED ORDER — ACETAMINOPHEN 10 MG/ML IV SOLN
INTRAVENOUS | Status: AC
  Filled 2013-04-26: qty 100

## 2013-04-26 MED ORDER — SODIUM CHLORIDE 0.9 % IV SOLN
INTRAVENOUS | Status: DC
  Administered 2013-04-26: 15:00:00 via INTRAVENOUS

## 2013-04-26 MED ORDER — CLINDAMYCIN PHOSPHATE 600 MG/50ML IV SOLN
600.0000 mg | Freq: Four times a day (QID) | INTRAVENOUS | Status: AC
  Administered 2013-04-26 – 2013-04-27 (×3): 600 mg via INTRAVENOUS
  Filled 2013-04-26 (×3): qty 50

## 2013-04-26 MED ORDER — 0.9 % SODIUM CHLORIDE (POUR BTL) OPTIME
TOPICAL | Status: DC | PRN
  Administered 2013-04-26: 1000 mL

## 2013-04-26 MED ORDER — ZOLPIDEM TARTRATE 5 MG PO TABS: 5.0000 mg | ORAL_TABLET | Freq: Every evening | ORAL | Status: DC | PRN

## 2013-04-26 MED ORDER — METOCLOPRAMIDE HCL 5 MG/ML IJ SOLN: 5.0000 mg | Freq: Three times a day (TID) | INTRAMUSCULAR | Status: DC | PRN

## 2013-04-26 MED ORDER — DIPHENHYDRAMINE HCL 12.5 MG/5ML PO ELIX: 12.5000 mg | ORAL_SOLUTION | ORAL | Status: DC | PRN

## 2013-04-26 MED ORDER — ONDANSETRON HCL 4 MG/2ML IJ SOLN: 4.0000 mg | Freq: Four times a day (QID) | INTRAMUSCULAR | Status: DC | PRN

## 2013-04-26 MED ORDER — METHOCARBAMOL 100 MG/ML IJ SOLN: 500.0000 mg | Freq: Four times a day (QID) | INTRAVENOUS | Status: DC | PRN

## 2013-04-26 MED ORDER — OXYCODONE HCL 5 MG PO TABS
5.0000 mg | ORAL_TABLET | ORAL | Status: DC | PRN
  Administered 2013-04-26 – 2013-04-27 (×5): 10 mg via ORAL
  Filled 2013-04-26 (×5): qty 2

## 2013-04-26 MED ORDER — ONDANSETRON HCL 4 MG/2ML IJ SOLN
INTRAMUSCULAR | Status: DC | PRN
  Administered 2013-04-26: 4 mg via INTRAVENOUS

## 2013-04-26 MED ORDER — MORPHINE SULFATE 2 MG/ML IJ SOLN
INTRAMUSCULAR | Status: AC
  Filled 2013-04-26: qty 1

## 2013-04-26 SURGICAL SUPPLY — 68 items
BANDAGE ELASTIC 4 VELCRO ST LF (GAUZE/BANDAGES/DRESSINGS) ×2 IMPLANT
BANDAGE ELASTIC 6 VELCRO ST LF (GAUZE/BANDAGES/DRESSINGS) ×2 IMPLANT
BANDAGE ESMARK 6X9 LF (GAUZE/BANDAGES/DRESSINGS) ×1 IMPLANT
BIT DRILL CANN 2.7X625 NONSTRL (BIT) ×1 IMPLANT
BNDG CMPR 9X6 STRL LF SNTH (GAUZE/BANDAGES/DRESSINGS) ×1
BNDG COHESIVE 4X5 TAN STRL (GAUZE/BANDAGES/DRESSINGS) ×1 IMPLANT
BNDG COHESIVE 6X5 TAN STRL LF (GAUZE/BANDAGES/DRESSINGS) ×1 IMPLANT
BNDG ESMARK 6X9 LF (GAUZE/BANDAGES/DRESSINGS) ×2
CANISTER SUCTION 2500CC (MISCELLANEOUS) ×1 IMPLANT
CLEANER TIP ELECTROSURG 2X2 (MISCELLANEOUS) ×1 IMPLANT
CLOTH BEACON ORANGE TIMEOUT ST (SAFETY) ×2 IMPLANT
COVER MAYO STAND STRL (DRAPES) ×2 IMPLANT
CUFF TOURNIQUET SINGLE 34IN LL (TOURNIQUET CUFF) ×1 IMPLANT
CUFF TOURNIQUET SINGLE 44IN (TOURNIQUET CUFF) IMPLANT
DRAPE C-ARM 42X72 X-RAY (DRAPES) ×2 IMPLANT
DRAPE ORTHO SPLIT 77X108 STRL (DRAPES) ×4
DRAPE SURG ORHT 6 SPLT 77X108 (DRAPES) ×2 IMPLANT
DRAPE U-SHAPE 47X51 STRL (DRAPES) ×2 IMPLANT
DRSG PAD ABDOMINAL 8X10 ST (GAUZE/BANDAGES/DRESSINGS) ×2 IMPLANT
DURAPREP 26ML APPLICATOR (WOUND CARE) ×2 IMPLANT
ELECT REM PT RETURN 9FT ADLT (ELECTROSURGICAL) ×2
ELECTRODE REM PT RTRN 9FT ADLT (ELECTROSURGICAL) ×1 IMPLANT
EVACUATOR 1/8 PVC DRAIN (DRAIN) IMPLANT
GAUZE XEROFORM 1X8 LF (GAUZE/BANDAGES/DRESSINGS) ×2 IMPLANT
GLOVE BIO SURGEON STRL SZ7.5 (GLOVE) ×1 IMPLANT
GLOVE BIO SURGEON STRL SZ8 (GLOVE) ×2 IMPLANT
GLOVE BIOGEL PI IND STRL 8 (GLOVE) ×1 IMPLANT
GLOVE BIOGEL PI INDICATOR 8 (GLOVE) ×2
GLOVE ORTHO TXT STRL SZ7.5 (GLOVE) ×1 IMPLANT
GOWN EXTRA PROTECTION XXL 0583 (GOWNS) ×1 IMPLANT
GOWN PREVENTION PLUS LG XLONG (DISPOSABLE) IMPLANT
GOWN PREVENTION PLUS XLARGE (GOWN DISPOSABLE) ×2 IMPLANT
GOWN STRL NON-REIN LRG LVL3 (GOWN DISPOSABLE) ×4 IMPLANT
GUIDEWIRE THREADED 150MM (WIRE) ×3 IMPLANT
IMMOBILIZER KNEE 22 UNIV (SOFTGOODS) ×1 IMPLANT
KIT BASIN OR (CUSTOM PROCEDURE TRAY) ×2 IMPLANT
KIT ROOM TURNOVER OR (KITS) ×2 IMPLANT
MANIFOLD NEPTUNE II (INSTRUMENTS) ×1 IMPLANT
NS IRRIG 1000ML POUR BTL (IV SOLUTION) ×2 IMPLANT
PACK ORTHO EXTREMITY (CUSTOM PROCEDURE TRAY) ×2 IMPLANT
PAD ARMBOARD 7.5X6 YLW CONV (MISCELLANEOUS) ×4 IMPLANT
PAD CAST 4YDX4 CTTN HI CHSV (CAST SUPPLIES) ×1 IMPLANT
PADDING CAST COTTON 4X4 STRL (CAST SUPPLIES) ×2
PADDING CAST COTTON 6X4 STRL (CAST SUPPLIES) ×2 IMPLANT
PASSER SUT SWANSON 36MM LOOP (INSTRUMENTS) ×1 IMPLANT
PENCIL BUTTON HOLSTER BLD 10FT (ELECTRODE) ×2 IMPLANT
SPONGE GAUZE 4X4 12PLY (GAUZE/BANDAGES/DRESSINGS) ×2 IMPLANT
SPONGE LAP 18X18 X RAY DECT (DISPOSABLE) ×2 IMPLANT
STAPLER VISISTAT 35W (STAPLE) ×2 IMPLANT
STOCKINETTE IMPERVIOUS 9X36 MD (GAUZE/BANDAGES/DRESSINGS) ×1 IMPLANT
STOCKINETTE IMPERVIOUS LG (DRAPES) ×2 IMPLANT
SUCTION FRAZIER TIP 10 FR DISP (SUCTIONS) ×2 IMPLANT
SUT ETHIBOND NAB CT1 #1 30IN (SUTURE) ×1 IMPLANT
SUT FIBERWIRE #2 38 T-5 BLUE (SUTURE) ×4
SUT VIC AB 0 CT1 27 (SUTURE) ×2
SUT VIC AB 0 CT1 27XBRD ANBCTR (SUTURE) ×1 IMPLANT
SUT VIC AB 1 CT1 27 (SUTURE) ×2
SUT VIC AB 1 CT1 27XBRD ANBCTR (SUTURE) IMPLANT
SUT VIC AB 2-0 CT1 27 (SUTURE) ×2
SUT VIC AB 2-0 CT1 TAPERPNT 27 (SUTURE) ×1 IMPLANT
SUTURE FIBERWR #2 38 T-5 BLUE (SUTURE) IMPLANT
SYR BULB IRRIGATION 50ML (SYRINGE) ×2 IMPLANT
TOWEL OR 17X24 6PK STRL BLUE (TOWEL DISPOSABLE) ×2 IMPLANT
TOWEL OR 17X26 10 PK STRL BLUE (TOWEL DISPOSABLE) ×2 IMPLANT
TUBE CONNECTING 12X1/4 (SUCTIONS) ×2 IMPLANT
UNDERPAD 30X30 INCONTINENT (UNDERPADS AND DIAPERS) ×2 IMPLANT
WATER STERILE IRR 1000ML POUR (IV SOLUTION) ×1 IMPLANT
YANKAUER SUCT BULB TIP NO VENT (SUCTIONS) ×2 IMPLANT

## 2013-04-26 NOTE — Anesthesia Preprocedure Evaluation (Addendum)
Anesthesia Evaluation  Patient identified by MRN, date of birth, ID band Patient awake    Reviewed: Allergy & Precautions, H&P , NPO status   History of Anesthesia Complications Negative for: history of anesthetic complications  Airway Mallampati: II TM Distance: >3 FB Neck ROM: Full    Dental  (+) Edentulous Upper, Poor Dentition and Dental Advisory Given   Pulmonary pneumonia -, resolved, Current Smoker,  breath sounds clear to auscultation  - decreased breath sounds      Cardiovascular negative cardio ROS  Rhythm:Regular Rate:Normal     Neuro/Psych negative neurological ROS     GI/Hepatic negative GI ROS, Neg liver ROS,   Endo/Other  negative endocrine ROSMorbid obesity  Renal/GU negative Renal ROS     Musculoskeletal   Abdominal (+) + obese,   Peds  Hematology   Anesthesia Other Findings   Reproductive/Obstetrics                         Anesthesia Physical Anesthesia Plan  ASA: II  Anesthesia Plan: General   Post-op Pain Management:    Induction: Intravenous  Airway Management Planned: LMA  Additional Equipment:   Intra-op Plan:   Post-operative Plan: Extubation in OR  Informed Consent: I have reviewed the patients History and Physical, chart, labs and discussed the procedure including the risks, benefits and alternatives for the proposed anesthesia with the patient or authorized representative who has indicated his/her understanding and acceptance.   Dental advisory given  Plan Discussed with: CRNA and Surgeon  Anesthesia Plan Comments:         Anesthesia Quick Evaluation

## 2013-04-26 NOTE — H&P (Signed)
[name] is an 47 y.o. male.   Chief Complaint:   Left knee pain with known patella fracture HPI:   47 yo male who fractured his left patella when he fell directly on to a concrete floor when he tripped going up some stairs.  Was seen in the ER and found to have a comminuted patella fracture.  He now presents for definitive surgical fixation of this fracture.  He understands fully the risks and benefits involved and gives informed consent.  Past Medical History  Diagnosis Date  . Pneumonia     Hx of 2014  . Numbness and tingling of both legs     Hx: of  . Lumbar herniated disc     Hx of    Past Surgical History  Procedure Laterality Date  . Back surgery      Family History  Problem Relation Age of Onset  . Hypertension Father    Social History:  reports that he has been smoking Cigars.  He does not have any smokeless tobacco history on file. He reports that  drinks alcohol. He reports that he does not use illicit drugs.  Allergies:  Allergies  Allergen Reactions  . Penicillins Hives    Medications Prior to Admission  Medication Sig Dispense Refill  . HYDROmorphone (DILAUDID) 2 MG tablet Take 1 tablet (2 mg total) by mouth every 4 (four) hours as needed for pain.  20 tablet  0  . oxyCODONE-acetaminophen (PERCOCET/ROXICET) 5-325 MG per tablet Take 1-2 tablets by mouth every 6 (six) hours as needed for pain (6-8 hours).        Results for orders placed during the hospital encounter of 04/24/13 (from the past 48 hour(s))  CBC     Status: Abnormal   Collection Time    04/24/13  3:38 PM      Result Value Range   WBC 13.5 (*) 4.0 - 10.5 K/uL   RBC 4.62  4.22 - 5.81 MIL/uL   Hemoglobin 14.8  13.0 - 17.0 g/dL   HCT 45.4  09.8 - 11.9 %   MCV 90.7  78.0 - 100.0 fL   MCH 32.0  26.0 - 34.0 pg   MCHC 35.3  30.0 - 36.0 g/dL   RDW 14.7  82.9 - 56.2 %   Platelets 289  150 - 400 K/uL  SURGICAL PCR SCREEN     Status: None   Collection Time    04/24/13  3:39 PM      Result  Value Range   MRSA, PCR NEGATIVE  NEGATIVE   Staphylococcus aureus NEGATIVE  NEGATIVE   Comment:            The Xpert SA Assay (FDA     approved for NASAL specimens     in patients over 20 years of age),     is one component of     a comprehensive surveillance     program.  Test performance has     been validated by [employer] for patients greater     than or equal to 21 year old.     It is not intended     to diagnose infection nor to     guide or monitor treatment.   No results found.  Review of Systems  All other systems reviewed and are negative.    Blood pressure 152/87, pulse 90, temperature 97.2 F (36.2 C), temperature source Oral, resp. rate 20, SpO2 97.00%. Physical Exam  Constitutional: He is oriented to person, place, and time. He appears well-developed and well-nourished.  HENT:  Head: Normocephalic and atraumatic.  Eyes: EOM are normal. Pupils are equal, round, and reactive to light.  Neck: Normal range of motion. Neck supple.  Cardiovascular: Normal rate and regular rhythm.   Respiratory: Effort normal and breath sounds normal.  GI: Soft. Bowel sounds are normal.  Musculoskeletal:       Left knee: He exhibits decreased range of motion, swelling, effusion and ecchymosis. Tenderness found. Patellar tendon tenderness noted.  Neurological: He is alert and oriented to person, place, and time.  Skin: Skin is warm and dry.  Psychiatric: He has a normal mood and affect.     Assessment/Plan Left closed patella fracture in multiple pieces 1) To the OR today for ORIF of his left patella  Tura Roller Y 04/26/2013, 7:27 AM

## 2013-04-26 NOTE — Preoperative (Signed)
Beta Blockers   Reason not to administer Beta Blockers:Not Applicable 

## 2013-04-26 NOTE — Anesthesia Procedure Notes (Signed)
Procedure Name: LMA Insertion Date/Time: 04/26/2013 7:43 AM Performed by: Gayla Medicus Pre-anesthesia Checklist: Patient identified, Timeout performed, Emergency Drugs available, Suction available and Patient being monitored Patient Re-evaluated:Patient Re-evaluated prior to inductionOxygen Delivery Method: Circle system utilized Preoxygenation: Pre-oxygenation with 100% oxygen Intubation Type: IV induction LMA: LMA with gastric port inserted LMA Size: 5.0 Number of attempts: 1 Placement Confirmation: positive ETCO2 and breath sounds checked- equal and bilateral Tube secured with: Tape Dental Injury: Teeth and Oropharynx as per pre-operative assessment

## 2013-04-26 NOTE — Anesthesia Postprocedure Evaluation (Signed)
  Anesthesia Post-op Note  Patient: [name]  Procedure(s) Performed: Procedure(s): Partial catalectomy with advancement of patella tendon (Left)  Patient Location: PACU  Anesthesia Type:General  Level of Consciousness: awake  Airway and Oxygen Therapy: Patient Spontanous Breathing  Post-op Pain: mild  Post-op Assessment: Post-op Vital signs reviewed  Post-op Vital Signs: stable  Complications: No apparent anesthesia complications

## 2013-04-26 NOTE — Brief Op Note (Signed)
04/26/2013  8:32 AM  PATIENT:  [name]  47 y.o. male  PRE-OPERATIVE DIAGNOSIS:  Left patella fracture  POST-OPERATIVE DIAGNOSIS:  Left patella fracture  PROCEDURE:  Procedure(s): Partial catalectomy with advancement of patella tendon (Left)  SURGEON:  Surgeon(s) and Role:    * Kathryne Hitch, MD - Primary  PHYSICIAN ASSISTANT: Rexene Edison, PA-C  ANESTHESIA:   general  EBL:  Total I/O In: 900 [I.V.:900] Out: -   BLOOD ADMINISTERED:none  DRAINS: none   LOCAL MEDICATIONS USED:  NONE  SPECIMEN:  No Specimen  DISPOSITION OF SPECIMEN:  N/A  COUNTS:  YES  TOURNIQUET:  * Missing tourniquet times found for documented tourniquets in log:  100309 *  DICTATION: .Other Dictation: Dictation Number [id number]  PLAN OF CARE: Admit for overnight observation  PATIENT DISPOSITION:  PACU - hemodynamically stable.   Delay start of Pharmacological VTE agent (>24hrs) due to surgical blood loss or risk of bleeding: no

## 2013-04-26 NOTE — Progress Notes (Signed)
Orthopedic Tech Progress Note Patient Details:  KIP CROPP [birth year] [id number]  Patient ID: Luvenia Starch, male   DOB: August 26, 1966, 47 y.o.   MRN: [id number] Brace order completed by Storm Frisk, Diahann Guajardo 04/26/2013, 2:31 PM

## 2013-04-26 NOTE — Transfer of Care (Signed)
Immediate Anesthesia Transfer of Care Note  Patient: [name]  Procedure(s) Performed: Procedure(s): Partial catalectomy with advancement of patella tendon (Left)  Patient Location: PACU  Anesthesia Type:General  Level of Consciousness: awake, alert  and oriented  Airway & Oxygen Therapy: Patient Spontanous Breathing and Patient connected to face mask oxygen  Post-op Assessment: Report given to PACU RN, Post -op Vital signs reviewed and stable and Patient moving all extremities X 4  Post vital signs: Reviewed and stable  Complications: No apparent anesthesia complications

## 2013-04-26 NOTE — Op Note (Signed)
[name], [name]                ACCOUNT NO.:  [id number]  MEDICAL RECORD NO.:  [id number]  LOCATION:  [facility]                         FACILITY:  [facility]  PHYSICIAN:  Vanita Panda. Magnus Ivan, M.D.DATE OF BIRTH:  1966-05-05  DATE OF PROCEDURE:  04/26/2013 DATE OF DISCHARGE:                              OPERATIVE REPORT   PREOPERATIVE DIAGNOSIS:  Left knee comminuted patella fracture.  POSTOPERATIVE DIAGNOSIS:  Left knee comminuted patella fracture.  PROCEDURE: 1. Left knee partial patellectomy. 2. Left knee advancement and repair of patella tendon.  SURGEON:  Vanita Panda. Magnus Ivan, M.D.  ASSISTING:  Richardean Canal, P.A.  ANESTHESIA:  General.  BLOOD LOSS:  Less than 50 mL.  TOURNIQUET TIME:  Less than 1 hour.  COMPLICATIONS:  None.  ANTIBIOTICS:  2 g IV Ancef.  INDICATIONS:  [name] is a 47 year old male, who last weekend was going up some steps when he felt the top step landing directly with his left knee directly on concrete.  He sustained a comminuted patella fracture.  He was placed in knee immobilizer, and was send to our clinic for followup.  He clinically recognize the nature of his injury and recommend him to undergo attempted repair the patella versus removing the pieces with advancement of the patellar tendon.  He understands that will be stronger likelihood given the comminuted nature of the fracture at the inferior pole.  The risks and benefit well understood by him.  He did wish to proceed with surgery.  PROCEDURE DESCRIPTION:  After informed consent was obtained, appropriate left knee was marked.  He was brought to the operating room, placed supine on the operating table.  General anesthesia was then obtained.  A nonsterile tourniquet was placed on his upper left leg and his left leg was prepped and draped from the thigh down the ankle with DuraPrep and sterile drapes including sterile stockinette.  A bump was placed under his hips as well.  A time-out  was called to identifying the correct patient, correct left knee.  We then used an Esmarch wrap on the leg and tourniquet was inflated to 300 mm of pressure.  We then made a midline incision directly over the patella and dissected down to the knee joint. We did not have to perform an arthrotomy to the arthrodesis, I think it is already there.  The patella was in 3 pieces with a large superior piece into smaller inferior pieces that were not repairable.  I excised these pieces, irrigated out the knee joint completely.  I then used a #2 FiberWire suture with x2 and ran Krackow sutures through the patellar tendon.  We then placed 3 guide wires in a parallel format to the patella and drilled the 2 cannulated drill.  We then passed the FiberWire lens up through each drill hole to tie these superiorly over bony bridge to advance the tendon.  I then oversewed this with a #1 Ethibond suture, and the patellar tendon and patella.  I then repaired the retinaculum with an interrupted #1 Vicryl on both sides of the patellar retinaculum.  We then irrigated the knee again.  We closed the deep tissue 0 Vicryl followed by 2-0 Vicryl in subcutaneous  tissue, and staples on the skin.  Well-padded sterile dressing was applied and the knee was placed back in a knee immobilizer.  The tourniquet was let down again, the toes pinked nicely.  He was awakened, extubated, and taken to recovery room in stable condition.  All final counts correct.  There were no complications noted.     Vanita Panda. Magnus Ivan, M.D.     CYB/MEDQ  D:  04/26/2013  T:  04/26/2013  Job:  [id number]

## 2013-04-27 MED ORDER — OXYCODONE-ACETAMINOPHEN 5-325 MG PO TABS: 1.0000 | ORAL_TABLET | Freq: Four times a day (QID) | ORAL | Status: DC | PRN

## 2013-04-27 MED ORDER — METHOCARBAMOL 500 MG PO TABS: 500.0000 mg | ORAL_TABLET | Freq: Four times a day (QID) | ORAL | Status: DC | PRN

## 2013-04-27 MED ORDER — ASPIRIN EC 325 MG PO TBEC: 325.0000 mg | DELAYED_RELEASE_TABLET | Freq: Two times a day (BID) | ORAL | Status: DC

## 2013-04-27 NOTE — Discharge Summary (Signed)
Patient ID: [name] MRN: [id number] DOB/AGE: [birth year] 47 y.o.  Admit date: 04/26/2013 Discharge date: 04/27/2013  Admission Diagnoses:  Principal Problem:   Right patella fracture   Discharge Diagnoses:  Same  Past Medical History  Diagnosis Date  . Pneumonia     Hx of 2014  . Numbness and tingling of both legs     Hx: of  . Lumbar herniated disc     Hx of    Surgeries: Procedure(s): Partial catalectomy with advancement of patella tendon on 04/26/2013   Consultants:    Discharged Condition: Improved  Hospital Course: [name] is an 47 y.o. male who was admitted 04/26/2013 for operative treatment ofRight patella fracture. Patient has severe unremitting pain that affects sleep, daily activities, and work/hobbies. After pre-op clearance the patient was taken to the operating room on 04/26/2013 and underwent  Procedure(s): Partial catalectomy with advancement of patella tendon.    Patient was given perioperative antibiotics: Anti-infectives   Start     Dose/Rate Route Frequency Ordered Stop   04/26/13 1500  clindamycin (CLEOCIN) IVPB 600 mg     600 mg 100 mL/hr over 30 Minutes Intravenous Every 6 hours 04/26/13 1358 04/27/13 0346   04/26/13 0600  clindamycin (CLEOCIN) IVPB 900 mg     900 mg 100 mL/hr over 30 Minutes Intravenous On call to O.R. 04/25/13 1355 04/26/13 0745       Patient was given sequential compression devices, early ambulation, and chemoprophylaxis to prevent DVT.  Patient benefited maximally from hospital stay and there were no complications.    Recent vital signs: Patient Vitals for the past 24 hrs:  BP Temp Pulse Resp SpO2 Height Weight  04/27/13 0639 113/61 mmHg 98.3 F (36.8 C) 98 16 94 % - -  04/26/13 2100 114/71 mmHg 99.8 F (37.7 C) 97 16 97 % - -  04/26/13 1900 - - - - - 6\' 3"  (1.905 m) 126.236 kg (278 lb 4.8 oz)  04/26/13 1340 121/65 mmHg 98.5 F (36.9 C) 95 16 100 % - -  04/26/13 1327 128/81 mmHg - 84 17 96 % - -  04/26/13  1325 - 98.6 F (37 C) - - - - -  04/26/13 1312 129/77 mmHg - 91 15 95 % - -  04/26/13 1257 136/84 mmHg - 80 16 98 % - -  04/26/13 1242 128/75 mmHg - 99 18 95 % - -  04/26/13 1225 - 98.5 F (36.9 C) - - - - -  04/26/13 0957 147/87 mmHg - 104 26 96 % - -  04/26/13 0942 155/93 mmHg - 97 18 96 % - -  04/26/13 0927 154/101 mmHg - 98 20 95 % - -  04/26/13 0912 168/100 mmHg - 98 18 99 % - -  04/26/13 0901 177/92 mmHg - 99 16 89 % - -  04/26/13 0857 182/103 mmHg - 100 23 90 % - -  04/26/13 0855 - 98.9 F (37.2 C) - - - - -     Recent laboratory studies:  Recent Labs  04/24/13 1538  WBC 13.5*  HGB 14.8  HCT 41.9  PLT 289     Discharge Medications:     Medication List    STOP taking these medications       HYDROmorphone 2 MG tablet  Commonly known as:  DILAUDID      TAKE these medications       aspirin EC 325 MG tablet  Take 1 tablet (325 mg total) by mouth  2 (two) times daily after a meal.     methocarbamol 500 MG tablet  Commonly known as:  ROBAXIN  Take 1 tablet (500 mg total) by mouth every 6 (six) hours as needed.     oxyCODONE-acetaminophen 5-325 MG per tablet  Commonly known as:  PERCOCET/ROXICET  Take 1-2 tablets by mouth every 6 (six) hours as needed for pain (6-8 hours).        Diagnostic Studies: Dg Chest 2 View  04/19/2013   *RADIOLOGY REPORT*  Clinical Data: Fall with dizziness.  Patellar fracture.  CHEST - 2 VIEW  Comparison: CT 08/29/2007 plain films 05/31/2007.  Findings: Lateral view degraded by patient arm position.  Midline trachea.  Mild cardiomegaly, accentuated by low lung volumes on the frontal. No pleural effusion or pneumothorax. Diffuse peribronchial thickening.  Clear lungs.  IMPRESSION: 1.  No acute cardiopulmonary disease. 2.  Mild peribronchial thickening which may relate to chronic bronchitis or smoking.   Original Report Authenticated By: Jeronimo Greaves, M.D.   Dg Knee Complete 4 Views Left  04/19/2013   *RADIOLOGY REPORT*  Clinical Data:  Trauma.  Pain.  LEFT KNEE - COMPLETE 4+ VIEW  Comparison: None.  Findings: There is a comminuted fracture of the patella with mild overlying soft tissue swelling.  A small suprapatellar joint effusion.  Mild medial compartment osteoarthritis.  IMPRESSION: Comminuted patellar fracture with soft tissue swelling and small suprapatellar joint effusion.   Original Report Authenticated By: Jeronimo Greaves, M.D.    Disposition: 01-Home or Self Care      Discharge Orders   Future Orders Complete By Expires     Call MD / Call 911  As directed     Comments:      If you experience chest pain or shortness of breath, CALL 911 and be transported to the hospital emergency room.  If you develope a fever above 101 F, pus (white drainage) or increased drainage or redness at the wound, or calf pain, call your surgeon's office.    Constipation Prevention  As directed     Comments:      Drink plenty of fluids.  Prune juice may be helpful.  You may use a stool softener, such as Colace (over the counter) 100 mg twice a day.  Use MiraLax (over the counter) for constipation as needed.    Diet - low sodium heart healthy  As directed     Increase activity slowly as tolerated  As directed        Follow-up Information   Follow up with Kathryne Hitch, MD In 2 weeks.   Contact information:   [address] [place] [address] [phone]        Signed: Kathryne Hitch 04/27/2013, 8:39 AM

## 2013-04-27 NOTE — Evaluation (Signed)
Physical Therapy Evaluation Patient Details Name: [name] MRN: [id number] DOB: 09/16/1966 Today's Date: 04/27/2013 Time: 8295-6213 PT Time Calculation (min): 30 min  PT Assessment / Plan / Recommendation Clinical Impression  Pt is a 47 y.o. male s/p partial catalectomy with advancement of patella tendon. Pt moving well with therapy today. Presents with minor deficits in mobility, and decreased indpendence with gt/transfers due to precautions and pain in L LE. Would benefit from skilled PT while in acute setting to maximize functional mobility. Is safe from PT standpoint to D/C home with family and 24 hr care. Denies need for any equipment or HHPT.     PT Assessment  Patient needs continued PT services    Follow Up Recommendations  Other (comment) (Denies need of any additional therapy )    Does the patient have the potential to tolerate intense rehabilitation      Barriers to Discharge None      Equipment Recommendations       Recommendations for Other Services     Frequency Min 5X/week    Precautions / Restrictions Precautions Precautions: Knee Precaution Comments: no flexion Required Braces or Orthoses: Other Brace/Splint Other Brace/Splint: bledsoe brace Restrictions Weight Bearing Restrictions: Yes LLE Weight Bearing: Weight bearing as tolerated   Pertinent Vitals/Pain 7/10; pt repositioned with L LE elevated       Mobility  Bed Mobility Bed Mobility: Supine to Sit;Sitting - Scoot to Edge of Bed Supine to Sit: 4: Min assist;HOB elevated;With rails Sitting - Scoot to Edge of Bed: 4: Min assist Details for Bed Mobility Assistance: (A) to advance and control descent to leg off EOB Transfers Transfers: Sit to Stand;Stand to Sit Sit to Stand: 4: Min guard;From bed Stand to Sit: 4: Min guard;To chair/3-in-1 Details for Transfer Assistance: verbal cues for hand placement and safety; required min guard to steady intially due to pain in L LE with  WB Ambulation/Gait Ambulation/Gait Assistance: 5: Supervision Ambulation Distance (Feet): 40 Feet Assistive device: Rolling walker Ambulation/Gait Assistance Details: verbal cues for gt sequencing and safety with RW; pt able to increase WB on L LE as we increased amb; was intially anxious due to pain Gait Pattern: Step-through pattern;Trunk flexed Gait velocity: decreased due to pain Stairs: Yes Stairs Assistance: 5: Supervision Stairs Assistance Details (indicate cue type and reason): cues for proper sequencing and safety; pt able to demo good technique; stated he has been doing this and his mother is able to help him Stair Management Technique: Two rails;Step to pattern;Forwards Number of Stairs: 2 Wheelchair Mobility Wheelchair Mobility: No    Exercises     PT Diagnosis: Difficulty walking;Acute pain  PT Problem List: Decreased strength;Decreased range of motion;Decreased mobility;Pain PT Treatment Interventions: DME instruction;Stair training;Gait training;Functional mobility training;Therapeutic activities;Therapeutic exercise;Balance training;Neuromuscular re-education;Patient/family education   PT Goals    Visit Information  Last PT Received On: 04/27/13    Subjective Data  Subjective: Pt lying supine; agreeable to therapy in hopes of D/C toda Patient Stated Goal: D/C with family today   Prior Functioning  Home Living Lives With: Family Available Help at Discharge: Family;Available 24 hours/day Type of Home: House Home Access: Stairs to enter [employer] of Steps: 5 Entrance Stairs-Rails: Can reach both Home Layout: Two level;Able to live on main level with bedroom/bathroom Bathroom Shower/Tub: Tub/shower unit;C[employer]: Standard Bathroom Accessibility: Yes How Accessible: Accessible via walker Home Adaptive Equipment: Crutches;Walker - rolling Prior Function Level of Independence: Independent Able to Take Stairs?: Yes Driving:  Yes Vocation: Unemployed  Communication Communication: No difficulties Dominant Hand: Right    Cognition  Cognition Arousal/Alertness: Awake/alert Behavior During Therapy: WFL for tasks assessed/performed Overall Cognitive Status: Within Functional Limits for tasks assessed    Extremity/Trunk Assessment Right Lower Extremity Assessment RLE ROM/Strength/Tone: WFL for tasks assessed RLE Sensation: WFL - Light Touch Left Lower Extremity Assessment LLE ROM/Strength/Tone: Deficits;Unable to fully assess;Due to pain;Due to precautions LLE ROM/Strength/Tone Deficits: unable to perform SLR LLE Sensation: Deficits LLE Sensation Deficits: states he still feels numb  Trunk Assessment Trunk Assessment: Normal   Balance Balance Balance Assessed: Yes Static Standing Balance Static Standing - Balance Support: No upper extremity supported;During functional activity Static Standing - Level of Assistance: 5: Stand by assistance Static Standing - Comment/# of Minutes: able to stand and perform ADLs with stand by assist for safety   End of Session PT - End of Session Equipment Utilized During Treatment: Gait belt;Other (comment) (Bledsoe brace) Activity Tolerance: Patient tolerated treatment well Patient left: in chair;with call bell/phone within reach Nurse Communication: Mobility status  GP Functional Assessment Tool Used: clinical judgement  Functional Limitation: Mobility: Walking and moving around Mobility: Walking and Moving Around Current Status (J1914): At least 1 percent but less than 20 percent impaired, limited or restricted Mobility: Walking and Moving Around Goal Status [phone]): At least 1 percent but less than 20 percent impaired, limited or restricted Mobility: Walking and Moving Around Discharge Status [phone]): At least 1 percent but less than 20 percent impaired, limited or restricted   Donell Sievert, [place] [phone] 04/27/2013, 11:36 AM

## 2013-04-27 NOTE — Progress Notes (Signed)
Subjective: 1 Day Post-Op Procedure(s) (LRB): Partial catalectomy with advancement of patella tendon (Left) Patient reports pain as moderate.    Objective: Vital signs in last 24 hours: Temp:  [98.3 F (36.8 C)-99.8 F (37.7 C)] 98.3 F (36.8 C) (05/25 0639) Pulse Rate:  [80-104] 98 (05/25 0639) Resp:  [15-26] 16 (05/25 0639) BP: (113-182)/(61-103) 113/61 mmHg (05/25 0639) SpO2:  [89 %-100 %] 94 % (05/25 0639) Weight:  [126.236 kg (278 lb 4.8 oz)] 126.236 kg (278 lb 4.8 oz) (05/24 1900)  Intake/Output from previous day: 05/24 0701 - 05/25 0700 In: 1715 [P.O.:480; I.V.:1135; IV Piggyback:100] Out: 1000 [Urine:1000] Intake/Output this shift: Total I/O In: 480 [P.O.:480] Out: -    Recent Labs  04/24/13 1538  HGB 14.8    Recent Labs  04/24/13 1538  WBC 13.5*  RBC 4.62  HCT 41.9  PLT 289   No results found for this basename: NA, K, CL, CO2, BUN, CREATININE, GLUCOSE, CALCIUM,  in the last 72 hours No results found for this basename: LABPT, INR,  in the last 72 hours  Sensation intact distally Intact pulses distally Dorsiflexion/Plantar flexion intact Incision: no drainage No cellulitis present Compartment soft  Assessment/Plan: 1 Day Post-Op Procedure(s) (LRB): Partial catalectomy with advancement of patella tendon (Left) Up with therapy WBAT in hinged knee brace (Brace set at 0 - 30 degrees) Discharge to home after PT  BLACKMAN,CHRISTOPHER Y 04/27/2013, 8:35 AM

## 2013-04-30 ENCOUNTER — Encounter (HOSPITAL_COMMUNITY): Payer: Self-pay | Admitting: Orthopaedic Surgery

## 2013-06-18 ENCOUNTER — Ambulatory Visit: Payer: Self-pay | Attending: Orthopaedic Surgery | Admitting: Physical Therapy

## 2013-06-18 DIAGNOSIS — M25569 Pain in unspecified knee: Secondary | ICD-10-CM | POA: Insufficient documentation

## 2013-06-18 DIAGNOSIS — M6281 Muscle weakness (generalized): Secondary | ICD-10-CM | POA: Insufficient documentation

## 2013-06-18 DIAGNOSIS — M25669 Stiffness of unspecified knee, not elsewhere classified: Secondary | ICD-10-CM | POA: Insufficient documentation

## 2013-06-18 DIAGNOSIS — R269 Unspecified abnormalities of gait and mobility: Secondary | ICD-10-CM | POA: Insufficient documentation

## 2013-06-18 DIAGNOSIS — R609 Edema, unspecified: Secondary | ICD-10-CM | POA: Insufficient documentation

## 2013-06-18 DIAGNOSIS — IMO0001 Reserved for inherently not codable concepts without codable children: Secondary | ICD-10-CM | POA: Insufficient documentation

## 2013-06-23 ENCOUNTER — Ambulatory Visit: Payer: Self-pay | Admitting: Rehabilitation

## 2013-06-26 ENCOUNTER — Ambulatory Visit: Payer: Self-pay | Admitting: Rehabilitation

## 2013-06-27 ENCOUNTER — Ambulatory Visit: Payer: Self-pay | Admitting: Rehabilitation

## 2013-06-30 ENCOUNTER — Ambulatory Visit: Payer: Self-pay | Admitting: Physical Therapy

## 2013-07-02 ENCOUNTER — Ambulatory Visit: Payer: Self-pay | Admitting: Rehabilitation

## 2013-07-03 ENCOUNTER — Ambulatory Visit: Payer: Self-pay | Admitting: Rehabilitation

## 2013-07-07 ENCOUNTER — Ambulatory Visit: Payer: Self-pay | Attending: Orthopaedic Surgery | Admitting: Physical Therapy

## 2013-07-07 DIAGNOSIS — M25569 Pain in unspecified knee: Secondary | ICD-10-CM | POA: Insufficient documentation

## 2013-07-07 DIAGNOSIS — R609 Edema, unspecified: Secondary | ICD-10-CM | POA: Insufficient documentation

## 2013-07-07 DIAGNOSIS — IMO0001 Reserved for inherently not codable concepts without codable children: Secondary | ICD-10-CM | POA: Insufficient documentation

## 2013-07-07 DIAGNOSIS — M25669 Stiffness of unspecified knee, not elsewhere classified: Secondary | ICD-10-CM | POA: Insufficient documentation

## 2013-07-07 DIAGNOSIS — R269 Unspecified abnormalities of gait and mobility: Secondary | ICD-10-CM | POA: Insufficient documentation

## 2013-07-07 DIAGNOSIS — M6281 Muscle weakness (generalized): Secondary | ICD-10-CM | POA: Insufficient documentation

## 2013-07-09 ENCOUNTER — Ambulatory Visit: Payer: Self-pay | Admitting: Rehabilitation

## 2013-07-10 ENCOUNTER — Ambulatory Visit: Payer: Self-pay | Admitting: Rehabilitation

## 2013-07-14 ENCOUNTER — Ambulatory Visit: Payer: Self-pay | Admitting: Physical Therapy

## 2013-07-16 ENCOUNTER — Ambulatory Visit: Payer: Self-pay | Admitting: Rehabilitation

## 2013-07-17 ENCOUNTER — Ambulatory Visit: Payer: Self-pay | Admitting: Rehabilitation

## 2013-07-21 ENCOUNTER — Ambulatory Visit: Payer: Self-pay | Admitting: Physical Therapy

## 2013-07-23 ENCOUNTER — Ambulatory Visit: Payer: Self-pay | Admitting: Rehabilitation

## 2013-07-24 ENCOUNTER — Ambulatory Visit: Payer: Self-pay | Admitting: Rehabilitation

## 2013-07-28 ENCOUNTER — Ambulatory Visit: Payer: Self-pay | Admitting: Physical Therapy

## 2013-07-30 ENCOUNTER — Ambulatory Visit: Payer: Self-pay | Admitting: Physical Therapy

## 2013-07-31 ENCOUNTER — Ambulatory Visit: Payer: Self-pay | Admitting: Rehabilitation

## 2013-08-05 ENCOUNTER — Ambulatory Visit: Payer: Self-pay | Attending: Orthopaedic Surgery | Admitting: Rehabilitation

## 2013-08-05 DIAGNOSIS — R609 Edema, unspecified: Secondary | ICD-10-CM | POA: Insufficient documentation

## 2013-08-05 DIAGNOSIS — IMO0001 Reserved for inherently not codable concepts without codable children: Secondary | ICD-10-CM | POA: Insufficient documentation

## 2013-08-05 DIAGNOSIS — M25669 Stiffness of unspecified knee, not elsewhere classified: Secondary | ICD-10-CM | POA: Insufficient documentation

## 2013-08-05 DIAGNOSIS — R269 Unspecified abnormalities of gait and mobility: Secondary | ICD-10-CM | POA: Insufficient documentation

## 2013-08-05 DIAGNOSIS — M25569 Pain in unspecified knee: Secondary | ICD-10-CM | POA: Insufficient documentation

## 2013-08-05 DIAGNOSIS — M6281 Muscle weakness (generalized): Secondary | ICD-10-CM | POA: Insufficient documentation

## 2013-08-06 ENCOUNTER — Ambulatory Visit: Payer: Self-pay | Admitting: Physical Therapy

## 2013-08-07 ENCOUNTER — Ambulatory Visit: Payer: Self-pay | Admitting: Physical Therapy

## 2013-08-11 ENCOUNTER — Ambulatory Visit: Payer: Self-pay | Admitting: Physical Therapy

## 2013-08-12 ENCOUNTER — Ambulatory Visit: Payer: Self-pay | Admitting: Rehabilitation

## 2013-08-13 ENCOUNTER — Ambulatory Visit: Payer: Self-pay | Admitting: Rehabilitation

## 2013-08-14 ENCOUNTER — Ambulatory Visit: Payer: Self-pay | Admitting: Physical Therapy

## 2013-08-18 ENCOUNTER — Ambulatory Visit: Payer: Self-pay | Admitting: Physical Therapy

## 2013-08-19 ENCOUNTER — Ambulatory Visit: Payer: Self-pay | Admitting: Physical Therapy

## 2013-08-20 ENCOUNTER — Other Ambulatory Visit (HOSPITAL_COMMUNITY): Payer: Self-pay | Admitting: Orthopaedic Surgery

## 2013-08-20 ENCOUNTER — Ambulatory Visit: Payer: Self-pay | Admitting: Rehabilitation

## 2013-08-21 ENCOUNTER — Encounter (HOSPITAL_COMMUNITY): Payer: Self-pay | Admitting: Pharmacy Technician

## 2013-08-21 ENCOUNTER — Ambulatory Visit: Payer: Self-pay | Admitting: Physical Therapy

## 2013-08-25 ENCOUNTER — Encounter (HOSPITAL_COMMUNITY): Payer: Self-pay

## 2013-08-25 ENCOUNTER — Encounter (HOSPITAL_COMMUNITY)
Admission: RE | Admit: 2013-08-25 | Discharge: 2013-08-25 | Disposition: A | Discharge status: Home / Self Care | Payer: Self-pay | Source: Ambulatory Visit | Attending: Orthopaedic Surgery | Admitting: Orthopaedic Surgery

## 2013-08-25 DIAGNOSIS — Z01812 Encounter for preprocedural laboratory examination: Secondary | ICD-10-CM | POA: Insufficient documentation

## 2013-08-25 DIAGNOSIS — Z01818 Encounter for other preprocedural examination: Secondary | ICD-10-CM | POA: Insufficient documentation

## 2013-08-25 LAB — BASIC METABOLIC PANEL
BUN: 11 mg/dL (ref 6–23)
GFR calc non Af Amer: >90 mL/min (ref >90)
Glucose, Bld: 108 mg/dL — ABNORMAL HIGH (ref 70–99)
Potassium: 4.3 mEq/L (ref 3.5–5.1)

## 2013-08-25 LAB — CBC
HCT: 46.1 % (ref 39.0–52.0)
Hemoglobin: 16.9 g/dL (ref 13.0–17.0)
MCH: 32.6 pg (ref 26.0–34.0)
MCHC: 36.7 g/dL — ABNORMAL HIGH (ref 30.0–36.0)
MCV: 88.8 fL (ref 78.0–100.0)

## 2013-08-25 MED ORDER — CLINDAMYCIN PHOSPHATE 900 MG/50ML IV SOLN
900.0000 mg | INTRAVENOUS | Status: AC
  Administered 2013-08-26: 900 mg via INTRAVENOUS
  Filled 2013-08-25: qty 50

## 2013-08-25 NOTE — Pre-Procedure Instructions (Signed)
DAMARRI RAMPY  08/25/2013   Your procedure is scheduled on:  08-26-2013  Tuesday   Report to [facility]  2 * 3 at 11:30 AM.   Call this number if you have problems the morning of surgery: [phone]   Remember:   Do not eat food or drink liquids after midnight.    Take these medicines the morning of surgery with A SIP OF WATER: pain medication if needed   Do not wear jewelry  Do not wear lotions, powders, or perfumes.   Do not shave 48 hours prior to surgery. Men may shave face and neck.  Do not bring valuables to the hospital.  [facility] is not responsible for any belongings or valuables.  Contacts, dentures or bridgework may not be worn into surgery.  Leave suitcase in the car. After surgery it may be brought to your room   For patients admitted to the hospital, checkout time is 11:00 AM the day of discharge.   Patients discharged the day of surgery will not be allowed to drive home.   Name and phone number of your driver:     Special Instructions: Shower using CHG tonight and again in the morning   Please read over the following fact sheets that you were given: Pain Booklet and Surgical Site Infection Prevention

## 2013-08-26 ENCOUNTER — Ambulatory Visit (HOSPITAL_COMMUNITY): Payer: Self-pay | Admitting: Anesthesiology

## 2013-08-26 ENCOUNTER — Encounter (HOSPITAL_COMMUNITY)
Admission: RE | Disposition: A | Discharge status: Home / Self Care | Payer: Self-pay | Source: Ambulatory Visit | Attending: Orthopaedic Surgery

## 2013-08-26 ENCOUNTER — Encounter (HOSPITAL_COMMUNITY): Payer: Self-pay | Admitting: Anesthesiology

## 2013-08-26 ENCOUNTER — Ambulatory Visit (HOSPITAL_COMMUNITY)
Admission: RE | Admit: 2013-08-26 | Discharge: 2013-08-26 | Disposition: A | Discharge status: Home / Self Care | Payer: Self-pay | Source: Ambulatory Visit | Attending: Orthopaedic Surgery | Admitting: Orthopaedic Surgery

## 2013-08-26 ENCOUNTER — Encounter (HOSPITAL_COMMUNITY): Payer: Self-pay | Admitting: *Deleted

## 2013-08-26 DIAGNOSIS — M24669 Ankylosis, unspecified knee: Secondary | ICD-10-CM

## 2013-08-26 DIAGNOSIS — M79609 Pain in unspecified limb: Secondary | ICD-10-CM | POA: Insufficient documentation

## 2013-08-26 DIAGNOSIS — M24662 Ankylosis, left knee: Secondary | ICD-10-CM

## 2013-08-26 HISTORY — PX: KNEE ARTHROSCOPY: SHX127

## 2013-08-26 SURGERY — ARTHROSCOPY, KNEE
Anesthesia: General | Site: Knee | Laterality: Left | Wound class: Clean

## 2013-08-26 MED ORDER — MIDAZOLAM HCL 2 MG/2ML IJ SOLN
INTRAMUSCULAR | Status: AC
  Filled 2013-08-26: qty 2

## 2013-08-26 MED ORDER — PROPOFOL 10 MG/ML IV BOLUS
INTRAVENOUS | Status: DC | PRN
  Administered 2013-08-26: 200 mg via INTRAVENOUS

## 2013-08-26 MED ORDER — OXYCODONE-ACETAMINOPHEN 5-325 MG PO TABS: 1.0000 | ORAL_TABLET | ORAL | Status: DC | PRN

## 2013-08-26 MED ORDER — FENTANYL CITRATE 0.05 MG/ML IJ SOLN
INTRAMUSCULAR | Status: AC
  Filled 2013-08-26: qty 2

## 2013-08-26 MED ORDER — OXYCODONE HCL 5 MG PO TABS: 5.0000 mg | ORAL_TABLET | Freq: Once | ORAL | Status: DC | PRN

## 2013-08-26 MED ORDER — FENTANYL CITRATE 0.05 MG/ML IJ SOLN
INTRAMUSCULAR | Status: DC | PRN
  Administered 2013-08-26 (×2): 50 ug via INTRAVENOUS
  Administered 2013-08-26: 100 ug via INTRAVENOUS
  Administered 2013-08-26: 50 ug via INTRAVENOUS

## 2013-08-26 MED ORDER — LIDOCAINE HCL (CARDIAC) 10 MG/ML IV SOLN
INTRAVENOUS | Status: DC | PRN
  Administered 2013-08-26: 70 mg via INTRAVENOUS

## 2013-08-26 MED ORDER — HYDROMORPHONE HCL PF 1 MG/ML IJ SOLN
INTRAMUSCULAR | Status: DC | PRN
  Administered 2013-08-26: 1 mg via INTRAVENOUS

## 2013-08-26 MED ORDER — OXYCODONE-ACETAMINOPHEN 5-325 MG PO TABS
1.0000 | ORAL_TABLET | Freq: Once | ORAL | Status: AC
  Administered 2013-08-26: 2 via ORAL

## 2013-08-26 MED ORDER — HYDROMORPHONE HCL PF 1 MG/ML IJ SOLN
INTRAMUSCULAR | Status: AC
  Filled 2013-08-26: qty 1

## 2013-08-26 MED ORDER — MIDAZOLAM HCL 5 MG/5ML IJ SOLN
INTRAMUSCULAR | Status: DC | PRN
  Administered 2013-08-26: 2 mg via INTRAVENOUS

## 2013-08-26 MED ORDER — ONDANSETRON HCL 4 MG/2ML IJ SOLN: 4.0000 mg | Freq: Once | INTRAMUSCULAR | Status: DC | PRN

## 2013-08-26 MED ORDER — METHYLPREDNISOLONE ACETATE 40 MG/ML IJ SUSP
INTRAMUSCULAR | Status: AC
  Filled 2013-08-26: qty 2

## 2013-08-26 MED ORDER — LACTATED RINGERS IV SOLN
INTRAVENOUS | Status: DC
  Administered 2013-08-26 (×2): via INTRAVENOUS

## 2013-08-26 MED ORDER — OXYCODONE HCL 5 MG/5ML PO SOLN: 5.0000 mg | Freq: Once | ORAL | Status: DC | PRN

## 2013-08-26 MED ORDER — HYDROMORPHONE HCL PF 1 MG/ML IJ SOLN
0.2500 mg | INTRAMUSCULAR | Status: DC | PRN
  Administered 2013-08-26 (×4): 0.5 mg via INTRAVENOUS

## 2013-08-26 MED ORDER — HYDROMORPHONE HCL PF 1 MG/ML IJ SOLN
INTRAMUSCULAR | Status: AC
  Filled 2013-08-26: qty 2

## 2013-08-26 MED ORDER — METHYLPREDNISOLONE ACETATE 40 MG/ML IJ SUSP
INTRAMUSCULAR | Status: DC | PRN
  Administered 2013-08-26: 15:00:00 via INTRAMUSCULAR

## 2013-08-26 MED ORDER — OXYCODONE-ACETAMINOPHEN 5-325 MG PO TABS
ORAL_TABLET | ORAL | Status: AC
  Filled 2013-08-26: qty 2

## 2013-08-26 MED ORDER — BUPIVACAINE HCL (PF) 0.25 % IJ SOLN: INTRAMUSCULAR | Status: DC | PRN

## 2013-08-26 SURGICAL SUPPLY — 6 items
BANDAGE ADHESIVE 1X3 (GAUZE/BANDAGES/DRESSINGS) ×2 IMPLANT
CLOTH BEACON ORANGE TIMEOUT ST (SAFETY) ×2 IMPLANT
NDL HYPO 25GX1X1/2 BEV (NEEDLE) IMPLANT
NEEDLE HYPO 25GX1X1/2 BEV (NEEDLE) ×4 IMPLANT
SYR 50ML SLIP (SYRINGE) ×2 IMPLANT
SYR 5ML LL (SYRINGE) ×1 IMPLANT

## 2013-08-26 NOTE — Anesthesia Preprocedure Evaluation (Signed)

## 2013-08-26 NOTE — H&P (Signed)
[name] is an 47 y.o. male.   Chief Complaint:   Left knee stiffness with decreased motion; left foot pain HPI: 47 yo male several months post a fall in which he sustained a patella fracture and needed a patial patellectomy.  He presents with severe limitations in his knee flexion and obvious arthrofibrosis.  He also has been experiencing left foot pain.  We are recommending a manipulation of the left knee under anesthesia and a possible knee arthroscopy as well as a steroid injection in his left foot.  He understands fully the risks and benefits of surgery.  Past Medical History  Diagnosis Date  . Pneumonia     Hx of 2014  . Numbness and tingling of both legs     Hx: of  . Lumbar herniated disc     Hx of    Past Surgical History  Procedure Laterality Date  . Back surgery    . Orif patella Left 04/26/2013    Procedure: Partial catalectomy with advancement of patella tendon;  Surgeon: Kathryne Hitch, MD;  Location: [facility] OR;  Service: Orthopedics;  Laterality: Left;    Family History  Problem Relation Age of Onset  . Hypertension Father    Social History:  reports that he has been smoking Cigars.  He does not have any smokeless tobacco history on file. He reports that  drinks alcohol. He reports that he does not use illicit drugs.  Allergies:  Allergies  Allergen Reactions  . Penicillins Hives and Itching    Medications Prior to Admission  Medication Sig Dispense Refill  . HYDROcodone-acetaminophen (NORCO/VICODIN) 5-325 MG per tablet Take 1 tablet by mouth at bedtime as needed for pain. For pain        Results for orders placed during the hospital encounter of 08/25/13 (from the past 48 hour(s))  BASIC METABOLIC PANEL     Status: Abnormal   Collection Time    08/25/13 12:37 PM      Result Value Range   Sodium 136  135 - 145 mEq/L   Potassium 4.3  3.5 - 5.1 mEq/L   Chloride 100  96 - 112 mEq/L   CO2 26  19 - 32 mEq/L   Glucose, Bld 108 (*) 70 - 99 mg/dL   BUN  11  6 - 23 mg/dL   Creatinine, Ser 1.47  0.50 - 1.35 mg/dL   Calcium 9.6  8.4 - 82.9 mg/dL   GFR calc non Af Amer >90  >90 mL/min   GFR calc Af Amer >90  >90 mL/min   Comment: (NOTE)     The eGFR has been calculated using the CKD EPI equation.     This calculation has not been validated in all clinical situations.     eGFR's persistently <90 mL/min signify possible Chronic Kidney     Disease.  CBC     Status: Abnormal   Collection Time    08/25/13 12:37 PM      Result Value Range   WBC 10.3  4.0 - 10.5 K/uL   RBC 5.19  4.22 - 5.81 MIL/uL   Hemoglobin 16.9  13.0 - 17.0 g/dL   HCT 56.2  13.0 - 86.5 %   MCV 88.8  78.0 - 100.0 fL   MCH 32.6  26.0 - 34.0 pg   MCHC 36.7 (*) 30.0 - 36.0 g/dL   RDW 78.4  69.6 - 29.5 %   Platelets 232  150 - 400 K/uL   No results  found.  Review of Systems  All other systems reviewed and are negative.    Blood pressure 134/83, pulse 80, temperature 97.5 F (36.4 C), temperature source Oral, resp. rate 20, SpO2 100.00%. Physical Exam  Constitutional: He is oriented to person, place, and time. He appears well-developed and well-nourished.  HENT:  Head: Normocephalic and atraumatic.  Eyes: EOM are normal. Pupils are equal, round, and reactive to light.  Neck: Normal range of motion. Neck supple.  Cardiovascular: Normal rate and regular rhythm.   Respiratory: Effort normal and breath sounds normal.  GI: Soft. Bowel sounds are normal.  Musculoskeletal:       Left knee: He exhibits decreased range of motion.       Feet:  Neurological: He is alert and oriented to person, place, and time.  Skin: Skin is warm and dry.  Psychiatric: He has a normal mood and affect.     Assessment/Plan Left knee with severe arthrofibrosis post trauma with partial patellectomy; left foot pain with possible Morton's neuroma 1)  To the OR for a left knee manipulation under anesthesia, possible arthroscopy, and steroid injection into left foot.  Kathryne Hitch 08/26/2013, 12:18 PM

## 2013-08-26 NOTE — Brief Op Note (Signed)
08/26/2013  3:01 PM  PATIENT:  Luvenia Starch  47 y.o. male  PRE-OPERATIVE DIAGNOSIS:  Arthrofibrosis left knee post trauma, left foot Morton's Neuroma  POST-OPERATIVE DIAGNOSIS:  Arthrofibrosis left knee post trauma, left foot Morton's   PROCEDURE:  Procedure(s): Manipulation under anesthesia left knee, possible arthroscopy with lysis of adhesions and steroid injection left foot (Left)  SURGEON:  Surgeon(s) and Role:    * Kathryne Hitch, MD - Primary  PHYSICIAN ASSISTANT:   ASSISTANTS: none   ANESTHESIA:   local and general  EBL:   n/a  BLOOD ADMINISTERED:none  DRAINS: none   LOCAL MEDICATIONS USED:  marcaine  SPECIMEN:  No Specimen  DISPOSITION OF SPECIMEN:  N/A  COUNTS:  YES  TOURNIQUET:  * No tourniquets in log *  DICTATION: .Other Dictation: Dictation Number [id number]  PLAN OF CARE: Discharge to home after PACU  PATIENT DISPOSITION:  PACU - hemodynamically stable.   Delay start of Pharmacological VTE agent (>24hrs) due to surgical blood loss or risk of bleeding: not applicable

## 2013-08-26 NOTE — Anesthesia Postprocedure Evaluation (Signed)
  Anesthesia Post-op Note  Patient: [name]  Procedure(s) Performed: Procedure(s): Manipulation under anesthesia left knee w/ steriiod injection and steroid injection left foot (Left)  Patient Location: PACU  Anesthesia Type:General  Level of Consciousness: awake, alert , oriented and patient cooperative  Airway and Oxygen Therapy: Patient Spontanous Breathing  Post-op Pain: mild  Post-op Assessment: Post-op Vital signs reviewed, Patient's Cardiovascular Status Stable, Respiratory Function Stable, Patent Airway, No signs of Nausea or vomiting and Pain level controlled  Post-op Vital Signs: stable  Complications: No apparent anesthesia complications

## 2013-08-26 NOTE — Transfer of Care (Signed)
Immediate Anesthesia Transfer of Care Note  Patient: [name]  Procedure(s) Performed: Procedure(s): Manipulation under anesthesia left knee w/ steriiod injection and steroid injection left foot (Left)  Patient Location: PACU  Anesthesia Type:General  Level of Consciousness: awake, alert , oriented and patient cooperative  Airway & Oxygen Therapy: Patient Spontanous Breathing and Patient connected to nasal cannula oxygen  Post-op Assessment: Report given to PACU RN, Post -op Vital signs reviewed and stable and Patient moving all extremities  Post vital signs: Reviewed and stable  Complications: No apparent anesthesia complications

## 2013-08-27 ENCOUNTER — Ambulatory Visit: Payer: Self-pay | Admitting: Physical Therapy

## 2013-08-27 NOTE — Op Note (Signed)
NAMEBRAIDAN, [name] NO.:  [id number]  MEDICAL RECORD NO.:  [id number]  LOCATION:  [facility]                         FACILITY:  [facility]  PHYSICIAN:  Vanita Panda. Magnus Ivan, M.D.DATE OF BIRTH:  [birth year]  DATE OF PROCEDURE:  08/26/2013 DATE OF DISCHARGE:  08/26/2013                              OPERATIVE REPORT   PREOPERATIVE DIAGNOSES: 1. Severe arthrofibrosis and ankylosis, left knee, status post partial     patellectomy. 2. Left foot pain with questionable Morton's neuroma between 3rd and     4th metatarsals.  POSTOPERATIVE DIAGNOSES: 1. Severe arthrofibrosis and ankylosis, left knee, status post partial     patellectomy. 2. Left foot pain with questionable Morton's neuroma between 3rd and     4th metatarsals.  PROCEDURE: 1. Manipulation under anesthesia of left knee. 2. Steroid injection, left foot, between the 3rd and 4th metatarsals.  SURGEON:  Doneen Poisson, MD.  ANESTHESIA:  General.  BLOOD LOSS:  Not applicable.  COMPLICATIONS:  None.  INDICATIONS:  [name] is a 47 year old gentleman who many months ago underwent a left knee partial patellectomy due to severe inferior pole patella fracture with advancement of the patellar tendon.  He is going to do significant therapy on his knee but is unable to flex it much past 70 degrees.  At this point, we recommended a manipulation under anesthesia with a steroid injection of the knee.  He has been having foot pain for a while but x-rays were negative.  He felt like he is potentially developing a Morton's neuroma because of the way he was walking.  He wished to have a steroid injection of that foot while he was under general anesthesia.  PROCEDURE DESCRIPTION:  After informed consent was obtained, appropriate left knee and left foot were marked.  He was brought to the operating room and placed supine on the operating table.  General anesthesia was then obtained.  A time-out was called to  identify correct patient, correct left knee and left foot.  We then measured his knee range of motion from 0-70 degrees before manipulation.  Then, we slowly manipulated the knee.  We could hear adhesions freeing up, and I was able to flex him to past 110 degrees.  I put his knee through several cycles of flexion and extension to confirm this.  I then cleaned the knee with alcohol prep and placed an injection of 4 mL of plain Marcaine mixed with 1 mL of Depo-Medrol.  We then cleaned his foot between the 3rd and 4th metatarsals on the dorsum and placed a steroid injection of 1 mL of Marcaine and 1 mL of Depo-Medrol onto the foot.  We put Band-Aids on both these areas.  He was awakened, extubated, and taken to recovery room in stable condition.  All final counts were correct.  No other complications noted.  He will be discharged home from the recovery room, and he starts therapy again tomorrow on his knee for aggressive range of motion.     Vanita Panda. Magnus Ivan, M.D.     CYB/MEDQ  D:  08/26/2013  T:  08/27/2013  Job:  [id number]

## 2013-08-28 ENCOUNTER — Encounter (HOSPITAL_COMMUNITY): Payer: Self-pay | Admitting: Orthopaedic Surgery

## 2013-08-28 ENCOUNTER — Ambulatory Visit: Payer: Self-pay | Admitting: Physical Therapy

## 2013-08-29 ENCOUNTER — Ambulatory Visit: Payer: Self-pay | Admitting: Rehabilitation

## 2013-09-01 ENCOUNTER — Ambulatory Visit: Payer: Self-pay | Admitting: Physical Therapy

## 2013-09-02 ENCOUNTER — Ambulatory Visit: Payer: Self-pay | Admitting: Physical Therapy

## 2013-09-04 ENCOUNTER — Ambulatory Visit: Payer: Self-pay | Attending: Orthopaedic Surgery | Admitting: Physical Therapy

## 2013-09-04 DIAGNOSIS — IMO0001 Reserved for inherently not codable concepts without codable children: Secondary | ICD-10-CM | POA: Insufficient documentation

## 2013-09-04 DIAGNOSIS — R269 Unspecified abnormalities of gait and mobility: Secondary | ICD-10-CM | POA: Insufficient documentation

## 2013-09-04 DIAGNOSIS — M25669 Stiffness of unspecified knee, not elsewhere classified: Secondary | ICD-10-CM | POA: Insufficient documentation

## 2013-09-04 DIAGNOSIS — R609 Edema, unspecified: Secondary | ICD-10-CM | POA: Insufficient documentation

## 2013-09-04 DIAGNOSIS — M25569 Pain in unspecified knee: Secondary | ICD-10-CM | POA: Insufficient documentation

## 2013-09-04 DIAGNOSIS — M6281 Muscle weakness (generalized): Secondary | ICD-10-CM | POA: Insufficient documentation

## 2013-09-08 ENCOUNTER — Ambulatory Visit: Payer: Self-pay | Admitting: Rehabilitation

## 2013-09-17 ENCOUNTER — Ambulatory Visit: Payer: Self-pay | Admitting: Rehabilitation

## 2013-09-24 ENCOUNTER — Ambulatory Visit: Payer: Self-pay | Admitting: Physical Therapy

## 2013-09-25 ENCOUNTER — Ambulatory Visit: Payer: Self-pay | Admitting: Physical Therapy

## 2013-10-01 ENCOUNTER — Ambulatory Visit: Payer: Self-pay | Admitting: Rehabilitation

## 2013-10-08 ENCOUNTER — Ambulatory Visit: Payer: Self-pay | Attending: Orthopaedic Surgery | Admitting: Rehabilitation

## 2013-10-08 DIAGNOSIS — IMO0001 Reserved for inherently not codable concepts without codable children: Secondary | ICD-10-CM | POA: Insufficient documentation

## 2013-10-08 DIAGNOSIS — M6281 Muscle weakness (generalized): Secondary | ICD-10-CM | POA: Insufficient documentation

## 2013-10-08 DIAGNOSIS — R269 Unspecified abnormalities of gait and mobility: Secondary | ICD-10-CM | POA: Insufficient documentation

## 2013-10-08 DIAGNOSIS — M25669 Stiffness of unspecified knee, not elsewhere classified: Secondary | ICD-10-CM | POA: Insufficient documentation

## 2013-10-08 DIAGNOSIS — M25569 Pain in unspecified knee: Secondary | ICD-10-CM | POA: Insufficient documentation

## 2013-10-08 DIAGNOSIS — R609 Edema, unspecified: Secondary | ICD-10-CM | POA: Insufficient documentation

## 2013-10-15 ENCOUNTER — Ambulatory Visit: Payer: Self-pay | Admitting: Rehabilitation

## 2013-10-22 ENCOUNTER — Ambulatory Visit: Payer: Self-pay | Admitting: Physical Therapy

## 2014-06-09 ENCOUNTER — Emergency Department (HOSPITAL_BASED_OUTPATIENT_CLINIC_OR_DEPARTMENT_OTHER)
Admission: EM | Admit: 2014-06-09 | Discharge: 2014-06-09 | Disposition: A | Discharge status: Home / Self Care | Payer: Self-pay | Attending: Emergency Medicine | Admitting: Emergency Medicine

## 2014-06-09 ENCOUNTER — Encounter (HOSPITAL_BASED_OUTPATIENT_CLINIC_OR_DEPARTMENT_OTHER): Payer: Self-pay | Admitting: Emergency Medicine

## 2014-06-09 ENCOUNTER — Emergency Department (HOSPITAL_BASED_OUTPATIENT_CLINIC_OR_DEPARTMENT_OTHER): Payer: Self-pay

## 2014-06-09 DIAGNOSIS — Z79899 Other long term (current) drug therapy: Secondary | ICD-10-CM | POA: Insufficient documentation

## 2014-06-09 DIAGNOSIS — F17200 Nicotine dependence, unspecified, uncomplicated: Secondary | ICD-10-CM | POA: Insufficient documentation

## 2014-06-09 DIAGNOSIS — Z880 Allergy status to penicillin: Secondary | ICD-10-CM | POA: Insufficient documentation

## 2014-06-09 DIAGNOSIS — G44219 Episodic tension-type headache, not intractable: Secondary | ICD-10-CM | POA: Insufficient documentation

## 2014-06-09 DIAGNOSIS — R112 Nausea with vomiting, unspecified: Secondary | ICD-10-CM | POA: Insufficient documentation

## 2014-06-09 DIAGNOSIS — Z8701 Personal history of pneumonia (recurrent): Secondary | ICD-10-CM | POA: Insufficient documentation

## 2014-06-09 LAB — CBC WITH DIFFERENTIAL/PLATELET
BASOS ABS: 0 10*3/uL (ref 0.0–0.1)
Basophils Relative: 0 % (ref 0–1)
Eosinophils Absolute: 0.1 10*3/uL (ref 0.0–0.7)
Eosinophils Relative: 1 % (ref 0–5)
HEMATOCRIT: 44.2 % (ref 39.0–52.0)
Hemoglobin: 15.4 g/dL (ref 13.0–17.0)
LYMPHS PCT: 36 % (ref 12–46)
Lymphs Abs: 3.2 10*3/uL (ref 0.7–4.0)
MCH: 31.2 pg (ref 26.0–34.0)
MCHC: 34.8 g/dL (ref 30.0–36.0)
MCV: 89.5 fL (ref 78.0–100.0)
MONO ABS: 0.6 10*3/uL (ref 0.1–1.0)
Monocytes Relative: 7 % (ref 3–12)
NEUTROS ABS: 5 10*3/uL (ref 1.7–7.7)
Neutrophils Relative %: 56 % (ref 43–77)
PLATELETS: 223 10*3/uL (ref 150–400)
RBC: 4.94 MIL/uL (ref 4.22–5.81)
RDW: 12.6 % (ref 11.5–15.5)
WBC: 8.9 10*3/uL (ref 4.0–10.5)

## 2014-06-09 LAB — COMPREHENSIVE METABOLIC PANEL
ALK PHOS: 114 U/L (ref 39–117)
ALT: 16 U/L (ref 0–53)
AST: 15 U/L (ref 0–37)
Albumin: 3.6 g/dL (ref 3.5–5.2)
Anion gap: 13 (ref 5–15)
BILIRUBIN TOTAL: 0.2 mg/dL — AB (ref 0.3–1.2)
BUN: 9 mg/dL (ref 6–23)
CHLORIDE: 104 meq/L (ref 96–112)
CO2: 24 meq/L (ref 19–32)
Calcium: 9.5 mg/dL (ref 8.4–10.5)
Creatinine, Ser: 1 mg/dL (ref 0.50–1.35)
GFR calc Af Amer: >90 mL/min (ref >90)
GFR calc non Af Amer: 87 mL/min — ABNORMAL LOW (ref >90)
Glucose, Bld: 116 mg/dL — ABNORMAL HIGH (ref 70–99)
Potassium: 4.1 mEq/L (ref 3.7–5.3)
SODIUM: 141 meq/L (ref 137–147)
Total Protein: 7.2 g/dL (ref 6.0–8.3)

## 2014-06-09 MED ORDER — HYDROCHLOROTHIAZIDE 25 MG PO TABS: 25.0000 mg | ORAL_TABLET | Freq: Every day | ORAL | Status: DC

## 2014-06-09 NOTE — ED Notes (Signed)
Pt amb to room 1 with quick steady gait in nad. Pt reports intermittent dizzy feeling x 1 week. Pt states he also has pressure in the back of his head. Pt denies any other c/o.

## 2014-06-09 NOTE — ED Provider Notes (Signed)
CSN: [id number]     Arrival date & time 06/09/14  1232 History   First MD Initiated Contact with Patient 06/09/14 1324     Chief Complaint  Patient presents with  . Dizziness     (Consider location/radiation/quality/duration/timing/severity/associated sxs/prior Treatment) Patient is a 48 y.o. male presenting with dizziness. The history is provided by the patient. No language interpreter was used.  Dizziness Quality:  Lightheadedness Severity:  Mild Onset quality:  Gradual Duration:  1 day Timing:  Constant Progression:  Worsening Chronicity:  New Relieved by:  Nothing Worsened by:  Nothing tried Ineffective treatments:  None tried Associated symptoms: nausea and vomiting   Pt complains of feeling like the room is spinning.  Pt reports he had a headache today.   Pt felt tingling in his left arm  Past Medical History  Diagnosis Date  . Pneumonia     Hx of 2014  . Numbness and tingling of both legs     Hx: of  . Lumbar herniated disc     Hx of   Past Surgical History  Procedure Laterality Date  . Back surgery    . Orif patella Left 04/26/2013    Procedure: Partial catalectomy with advancement of patella tendon;  Surgeon: Mcarthur Rossetti, MD;  Location: [place];  Service: Orthopedics;  Laterality: Left;  . Knee arthroscopy Left 08/26/2013    Procedure: Manipulation under anesthesia left knee w/ steriiod injection and steroid injection left foot;  Surgeon: Mcarthur Rossetti, MD;  Location: [place];  Service: Orthopedics;  Laterality: Left;   Family History  Problem Relation Age of Onset  . Hypertension Father    History  Substance Use Topics  . Smoking status: Current Every Day Smoker    Types: Cigars  . Smokeless tobacco: Not on file  . Alcohol Use: Yes     Comment: occasional    Review of Systems  Gastrointestinal: Positive for nausea and vomiting.  Neurological: Positive for dizziness.  All other systems reviewed and are negative.     Allergies   Penicillins  Home Medications   Prior to Admission medications   Medication Sig Start Date End Date Taking? Authorizing Provider  oxyCODONE-acetaminophen (ROXICET) 5-325 MG per tablet Take 1-2 tablets by mouth every 4 (four) hours as needed for pain. 08/26/13   Mcarthur Rossetti, MD   BP 150/90  Pulse 81  Temp(Src) 98.4 F (36.9 C) (Oral)  Resp 18  SpO2 100% Physical Exam  Nursing note and vitals reviewed. Constitutional: He is oriented to person, place, and time. He appears well-developed and well-nourished.  HENT:  Head: Normocephalic.  Eyes: Conjunctivae and EOM are normal. Pupils are equal, round, and reactive to light.  Neck: Normal range of motion.  Pulmonary/Chest: Effort normal.  Abdominal: Soft. He exhibits no distension.  Musculoskeletal: Normal range of motion.  Neurological: He is alert and oriented to person, place, and time.  Skin: Skin is warm.  Psychiatric: He has a normal mood and affect.    ED Course  Procedures (including critical care time) Labs Review Labs Reviewed  CBC WITH DIFFERENTIAL  COMPREHENSIVE METABOLIC PANEL    Imaging Review No results found.   EKG Interpretation   Date/Time:  Tuesday June 09 2014 13:55:32 EDT Ventricular Rate:  60 PR Interval:  152 QRS Duration: 94 QT Interval:  406 QTC Calculation: 406 R Axis:   72 Text Interpretation:  Normal sinus rhythm Normal ECG Confirmed by  ZACKOWSKI  MD, SCOTT ([id number]) on 06/09/2014 2:16:19 PM  MDM   Final diagnoses:  Episodic tension-type headache, not intractable   Pt counseled on blood pressure.  Pt given rx for hctz.    Pt advised follow up for recheck of bp.   Dr. Helane Gunther in to see and examine. pt    Fransico Meadow, PA-C 06/09/14 [phone]

## 2014-06-09 NOTE — ED Provider Notes (Signed)
Medical screening examination/treatment/procedure(s) were conducted as a shared visit with non-physician practitioner(s) and myself.  I personally evaluated the patient during the encounter.   EKG Interpretation None      Results for orders placed during the hospital encounter of 51/02/58  BASIC METABOLIC PANEL      Result Value Ref Range   Sodium 136  135 - 145 mEq/L   Potassium 4.3  3.5 - 5.1 mEq/L   Chloride 100  96 - 112 mEq/L   CO2 26  19 - 32 mEq/L   Glucose, Bld 108 (*) 70 - 99 mg/dL   BUN 11  6 - 23 mg/dL   Creatinine, Ser 0.98  0.50 - 1.35 mg/dL   Calcium 9.6  8.4 - 10.5 mg/dL   GFR calc non Af Amer >90  >90 mL/min   GFR calc Af Amer >90  >90 mL/min  CBC      Result Value Ref Range   WBC 10.3  4.0 - 10.5 K/uL   RBC 5.19  4.22 - 5.81 MIL/uL   Hemoglobin 16.9  13.0 - 17.0 g/dL   HCT 46.1  39.0 - 52.0 %   MCV 88.8  78.0 - 100.0 fL   MCH 32.6  26.0 - 34.0 pg   MCHC 36.7 (*) 30.0 - 36.0 g/dL   RDW 12.8  11.5 - 15.5 %   Platelets 232  150 - 400 K/uL   No results found.  CT scan of the head is pending. Patient with some subtle symptoms for about a week. Sort of feeling lightheaded a little dizzy. Has some discomfort in the back of the head not severe quality moves his head suddenly gets some dizziness when he sits up quickly he gets some dizziness. His left arm felt odd no distinct numbness no distinct weakness. Patient went to fire station and they noted her blood pressure in both arms was elevated. Elevated higher in the left arm. I think it patient's head CT is negative patient can be discharged home maybe consideration for treatment with the Antivert if symptoms persist will need an MRI.  Fredia Sorrow, MD 06/09/14 1415

## 2014-06-09 NOTE — ED Notes (Signed)
Pt states when he lays flat "the room is spinning" but once he sits up or stands, the spinning stops after a few minutes.

## 2014-06-09 NOTE — Discharge Instructions (Signed)
Headaches, Frequently Asked Questions MIGRAINE HEADACHES Q: What is migraine? What causes it? How can I treat it? A: Generally, migraine headaches begin as a dull ache. Then they develop into a constant, throbbing, and pulsating pain. You may experience pain at the temples. You may experience pain at the front or back of one or both sides of the head. The pain is usually accompanied by a combination of:  Nausea.  Vomiting.  Sensitivity to light and noise. Some people (about 15%) experience an aura (see below) before an attack. The cause of migraine is believed to be chemical reactions in the brain. Treatment for migraine may include over-the-counter or prescription medications. It may also include self-help techniques. These include relaxation training and biofeedback.  Q: What is an aura? A: About 15% of people with migraine get an "aura". This is a sign of neurological symptoms that occur before a migraine headache. You may see wavy or jagged lines, dots, or flashing lights. You might experience tunnel vision or blind spots in one or both eyes. The aura can include visual or auditory hallucinations (something imagined). It may include disruptions in smell (such as strange odors), taste or touch. Other symptoms include:  Numbness.  A "pins and needles" sensation.  Difficulty in recalling or speaking the correct word. These neurological events may last as long as 60 minutes. These symptoms will fade as the headache begins. Q: What is a trigger? A: Certain physical or environmental factors can lead to or "trigger" a migraine. These include:  Foods.  Hormonal changes.  Weather.  Stress. It is important to remember that triggers are different for everyone. To help prevent migraine attacks, you need to figure out which triggers affect you. Keep a headache diary. This is a good way to track triggers. The diary will help you talk to your healthcare professional about your condition. Q: Does  weather affect migraines? A: Bright sunshine, hot, humid conditions, and drastic changes in barometric pressure may lead to, or "trigger," a migraine attack in some people. But studies have shown that weather does not act as a trigger for everyone with migraines. Q: What is the link between migraine and hormones? A: Hormones start and regulate many of your body's functions. Hormones keep your body in balance within a constantly changing environment. The levels of hormones in your body are unbalanced at times. Examples are during menstruation, pregnancy, or menopause. That can lead to a migraine attack. In fact, about three quarters of all women with migraine report that their attacks are related to the menstrual cycle.  Q: Is there an increased risk of stroke for migraine sufferers? A: The likelihood of a migraine attack causing a stroke is very remote. That is not to say that migraine sufferers cannot have a stroke associated with their migraines. In persons under age 53, the most common associated factor for stroke is migraine headache. But over the course of a person's normal life span, the occurrence of migraine headache may actually be associated with a reduced risk of dying from cerebrovascular disease due to stroke.  Q: What are acute medications for migraine? A: Acute medications are used to treat the pain of the headache after it has started. Examples over-the-counter medications, NSAIDs, ergots, and triptans.  Q: What are the triptans? A: Triptans are the newest class of abortive medications. They are specifically targeted to treat migraine. Triptans are vasoconstrictors. They moderate some chemical reactions in the brain. The triptans work on receptors in your brain. Triptans help  to restore the balance of a neurotransmitter called serotonin. Fluctuations in levels of serotonin are thought to be a main cause of migraine.  °Q: Are over-the-counter medications for migraine effective? °A:  Over-the-counter, or "OTC," medications may be effective in relieving mild to moderate pain and associated symptoms of migraine. But you should see your caregiver before beginning any treatment regimen for migraine.  °Q: What are preventive medications for migraine? °A: Preventive medications for migraine are sometimes referred to as "prophylactic" treatments. They are used to reduce the frequency, severity, and length of migraine attacks. Examples of preventive medications include antiepileptic medications, antidepressants, beta-blockers, calcium channel blockers, and NSAIDs (nonsteroidal anti-inflammatory drugs). °Q: Why are anticonvulsants used to treat migraine? °A: During the past few years, there has been an increased interest in antiepileptic drugs for the prevention of migraine. They are sometimes referred to as "anticonvulsants". Both epilepsy and migraine may be caused by similar reactions in the brain.  °Q: Why are antidepressants used to treat migraine? °A: Antidepressants are typically used to treat people with depression. They may reduce migraine frequency by regulating chemical levels, such as serotonin, in the brain.  °Q: What alternative therapies are used to treat migraine? °A: The term "alternative therapies" is often used to describe treatments considered outside the scope of conventional Western medicine. Examples of alternative therapy include acupuncture, acupressure, and yoga. Another common alternative treatment is herbal therapy. Some herbs are believed to relieve headache pain. Always discuss alternative therapies with your caregiver before proceeding. Some herbal products contain arsenic and other toxins. °TENSION HEADACHES °Q: What is a tension-type headache? What causes it? How can I treat it? °A: Tension-type headaches occur randomly. They are often the result of temporary stress, anxiety, fatigue, or anger. Symptoms include soreness in your temples, a tightening band-like sensation  around your head (a "vice-like" ache). Symptoms can also include a pulling feeling, pressure sensations, and contracting head and neck muscles. The headache begins in your forehead, temples, or the back of your head and neck. Treatment for tension-type headache may include over-the-counter or prescription medications. Treatment may also include self-help techniques such as relaxation training and biofeedback. °CLUSTER HEADACHES °Q: What is a cluster headache? What causes it? How can I treat it? °A: Cluster headache gets its name because the attacks come in groups. The pain arrives with little, if any, warning. It is usually on one side of the head. A tearing or bloodshot eye and a runny nose on the same side of the headache may also accompany the pain. Cluster headaches are believed to be caused by chemical reactions in the brain. They have been described as the most severe and intense of any headache type. Treatment for cluster headache includes prescription medication and oxygen. °SINUS HEADACHES °Q: What is a sinus headache? What causes it? How can I treat it? °A: When a cavity in the bones of the face and skull (a sinus) becomes inflamed, the inflammation will cause localized pain. This condition is usually the result of an allergic reaction, a tumor, or an infection. If your headache is caused by a sinus blockage, such as an infection, you will probably have a fever. An x-ray will confirm a sinus blockage. Your caregiver's treatment might include antibiotics for the infection, as well as antihistamines or decongestants.  °REBOUND HEADACHES °Q: What is a rebound headache? What causes it? How can I treat it? °A: A pattern of taking acute headache medications too often can lead to a condition known as "rebound headache."   A pattern of taking too much headache medication includes taking it more than 2 days per week or in excessive amounts. That means more than the label or a caregiver advises. With rebound  headaches, your medications not only stop relieving pain, they actually begin to cause headaches. Doctors treat rebound headache by tapering the medication that is being overused. Sometimes your caregiver will gradually substitute a different type of treatment or medication. Stopping may be a challenge. Regularly overusing a medication increases the potential for serious side effects. Consult a caregiver if you regularly use headache medications more than 2 days per week or more than the label advises. ADDITIONAL QUESTIONS AND ANSWERS Q: What is biofeedback? A: Biofeedback is a self-help treatment. Biofeedback uses special equipment to monitor your body's involuntary physical responses. Biofeedback monitors:  Breathing.  Pulse.  Heart rate.  Temperature.  Muscle tension.  Brain activity. Biofeedback helps you refine and perfect your relaxation exercises. You learn to control the physical responses that are related to stress. Once the technique has been mastered, you do not need the equipment any more. Q: Are headaches hereditary? A: Four out of five (80%) of people that suffer report a family history of migraine. Scientists are not sure if this is genetic or a family predisposition. Despite the uncertainty, a child has a 50% chance of having migraine if one parent suffers. The child has a 75% chance if both parents suffer.  Q: Can children get headaches? A: By the time they reach high school, most young people have experienced some type of headache. Many safe and effective approaches or medications can prevent a headache from occurring or stop it after it has begun.  Q: What type of doctor should I see to diagnose and treat my headache? A: Start with your primary caregiver. Discuss his or her experience and approach to headaches. Discuss methods of classification, diagnosis, and treatment. Your caregiver may decide to recommend you to a headache specialist, depending upon your symptoms or other  physical conditions. Having diabetes, allergies, etc., may require a more comprehensive and inclusive approach to your headache. The [employer] will provide, upon request, a list of [facility] physician members in your state. Document Released: 02/10/2004 Document Revised: 02/12/2012 Document Reviewed: 07/20/2008 [facility] Patient Information 2015 [place], [place]. This information is not intended to replace advice given to you by your health care provider. Make sure you discuss any questions you have with your health care provider. Hypertension Hypertension, commonly called high blood pressure, is when the force of blood pumping through your arteries is too strong. Your arteries are the blood vessels that carry blood from your heart throughout your body. A blood pressure reading consists of a higher number over a lower number, such as 110/72. The higher number (systolic) is the pressure inside your arteries when your heart pumps. The lower number (diastolic) is the pressure inside your arteries when your heart relaxes. Ideally you want your blood pressure below 120/80. Hypertension forces your heart to work harder to pump blood. Your arteries may become narrow or stiff. Having hypertension puts you at risk for heart disease, stroke, and other problems.  RISK FACTORS Some risk factors for high blood pressure are controllable. Others are not.  Risk factors you cannot control include:   Race. You may be at higher risk if you are African American.  Age. Risk increases with age.  Gender. Men are at higher risk than women before age 19 years. After age 39, women are at higher  risk than men. Risk factors you can control include:  Not getting enough exercise or physical activity.  Being overweight.  Getting too much fat, sugar, calories, or salt in your diet.  Drinking too much alcohol. SIGNS AND SYMPTOMS Hypertension does not usually cause signs or symptoms. Extremely high blood pressure  (hypertensive crisis) may cause headache, anxiety, shortness of breath, and nosebleed. DIAGNOSIS  To check if you have hypertension, your health care provider will measure your blood pressure while you are seated, with your arm held at the level of your heart. It should be measured at least twice using the same arm. Certain conditions can cause a difference in blood pressure between your right and left arms. A blood pressure reading that is higher than normal on one occasion does not mean that you need treatment. If one blood pressure reading is high, ask your health care provider about having it checked again. TREATMENT  Treating high blood pressure includes making lifestyle changes and possibly taking medication. Living a healthy lifestyle can help lower high blood pressure. You may need to change some of your habits. Lifestyle changes may include:  Following the DASH diet. This diet is high in fruits, vegetables, and whole grains. It is low in salt, red meat, and added sugars.  Getting at least 2 1/2 hours of brisk physical activity every week.  Losing weight if necessary.  Not smoking.  Limiting alcoholic beverages.  Learning ways to reduce stress. If lifestyle changes are not enough to get your blood pressure under control, your health care provider may prescribe medicine. You may need to take more than one. Work closely with your health care provider to understand the risks and benefits. HOME CARE INSTRUCTIONS  Have your blood pressure rechecked as directed by your health care provider.   Only take medicine as directed by your health care provider. Follow the directions carefully. Blood pressure medicines must be taken as prescribed. The medicine does not work as well when you skip doses. Skipping doses also puts you at risk for problems.   Do not smoke.   Monitor your blood pressure at home as directed by your health care provider. SEEK MEDICAL CARE IF:   You think you are  having a reaction to medicines taken.  You have recurrent headaches or feel dizzy.  You have swelling in your ankles.  You have trouble with your vision. SEEK IMMEDIATE MEDICAL CARE IF:  You develop a severe headache or confusion.  You have unusual weakness, numbness, or feel faint.  You have severe chest or abdominal pain.  You vomit repeatedly.  You have trouble breathing. MAKE SURE YOU:   Understand these instructions.  Will watch your condition.  Will get help right away if you are not doing well or get worse. Document Released: 11/20/2005 Document Revised: 11/25/2013 Document Reviewed: 09/12/2013 [facility] Patient Information 2015 [place], [place]. This information is not intended to replace advice given to you by your health care provider. Make sure you discuss any questions you have with your health care provider.  Headaches, Frequently Asked Questions MIGRAINE HEADACHES Q: What is migraine? What causes it? How can I treat it? A: Generally, migraine headaches begin as a dull ache. Then they develop into a constant, throbbing, and pulsating pain. You may experience pain at the temples. You may experience pain at the front or back of one or both sides of the head. The pain is usually accompanied by a combination of:  Nausea.  Vomiting.  Sensitivity to light  and noise. Some people (about 15%) experience an aura (see below) before an attack. The cause of migraine is believed to be chemical reactions in the brain. Treatment for migraine may include over-the-counter or prescription medications. It may also include self-help techniques. These include relaxation training and biofeedback.  Q: What is an aura? A: About 15% of people with migraine get an "aura". This is a sign of neurological symptoms that occur before a migraine headache. You may see wavy or jagged lines, dots, or flashing lights. You might experience tunnel vision or blind spots in one or both eyes. The aura can  include visual or auditory hallucinations (something imagined). It may include disruptions in smell (such as strange odors), taste or touch. Other symptoms include:  Numbness.  A "pins and needles" sensation.  Difficulty in recalling or speaking the correct word. These neurological events may last as long as 60 minutes. These symptoms will fade as the headache begins. Q: What is a trigger? A: Certain physical or environmental factors can lead to or "trigger" a migraine. These include:  Foods.  Hormonal changes.  Weather.  Stress. It is important to remember that triggers are different for everyone. To help prevent migraine attacks, you need to figure out which triggers affect you. Keep a headache diary. This is a good way to track triggers. The diary will help you talk to your healthcare professional about your condition. Q: Does weather affect migraines? A: Bright sunshine, hot, humid conditions, and drastic changes in barometric pressure may lead to, or "trigger," a migraine attack in some people. But studies have shown that weather does not act as a trigger for everyone with migraines. Q: What is the link between migraine and hormones? A: Hormones start and regulate many of your body's functions. Hormones keep your body in balance within a constantly changing environment. The levels of hormones in your body are unbalanced at times. Examples are during menstruation, pregnancy, or menopause. That can lead to a migraine attack. In fact, about three quarters of all women with migraine report that their attacks are related to the menstrual cycle.  Q: Is there an increased risk of stroke for migraine sufferers? A: The likelihood of a migraine attack causing a stroke is very remote. That is not to say that migraine sufferers cannot have a stroke associated with their migraines. In persons under age 17, the most common associated factor for stroke is migraine headache. But over the course of a  person's normal life span, the occurrence of migraine headache may actually be associated with a reduced risk of dying from cerebrovascular disease due to stroke.  Q: What are acute medications for migraine? A: Acute medications are used to treat the pain of the headache after it has started. Examples over-the-counter medications, NSAIDs, ergots, and triptans.  Q: What are the triptans? A: Triptans are the newest class of abortive medications. They are specifically targeted to treat migraine. Triptans are vasoconstrictors. They moderate some chemical reactions in the brain. The triptans work on receptors in your brain. Triptans help to restore the balance of a neurotransmitter called serotonin. Fluctuations in levels of serotonin are thought to be a main cause of migraine.  Q: Are over-the-counter medications for migraine effective? A: Over-the-counter, or "OTC," medications may be effective in relieving mild to moderate pain and associated symptoms of migraine. But you should see your caregiver before beginning any treatment regimen for migraine.  Q: What are preventive medications for migraine? A: Preventive medications for migraine are  sometimes referred to as "prophylactic" treatments. They are used to reduce the frequency, severity, and length of migraine attacks. Examples of preventive medications include antiepileptic medications, antidepressants, beta-blockers, calcium channel blockers, and NSAIDs (nonsteroidal anti-inflammatory drugs). Q: Why are anticonvulsants used to treat migraine? A: During the past few years, there has been an increased interest in antiepileptic drugs for the prevention of migraine. They are sometimes referred to as "anticonvulsants". Both epilepsy and migraine may be caused by similar reactions in the brain.  Q: Why are antidepressants used to treat migraine? A: Antidepressants are typically used to treat people with depression. They may reduce migraine frequency by  regulating chemical levels, such as serotonin, in the brain.  Q: What alternative therapies are used to treat migraine? A: The term "alternative therapies" is often used to describe treatments considered outside the scope of conventional Western medicine. Examples of alternative therapy include acupuncture, acupressure, and yoga. Another common alternative treatment is herbal therapy. Some herbs are believed to relieve headache pain. Always discuss alternative therapies with your caregiver before proceeding. Some herbal products contain arsenic and other toxins. TENSION HEADACHES Q: What is a tension-type headache? What causes it? How can I treat it? A: Tension-type headaches occur randomly. They are often the result of temporary stress, anxiety, fatigue, or anger. Symptoms include soreness in your temples, a tightening band-like sensation around your head (a "vice-like" ache). Symptoms can also include a pulling feeling, pressure sensations, and contracting head and neck muscles. The headache begins in your forehead, temples, or the back of your head and neck. Treatment for tension-type headache may include over-the-counter or prescription medications. Treatment may also include self-help techniques such as relaxation training and biofeedback. CLUSTER HEADACHES Q: What is a cluster headache? What causes it? How can I treat it? A: Cluster headache gets its name because the attacks come in groups. The pain arrives with little, if any, warning. It is usually on one side of the head. A tearing or bloodshot eye and a runny nose on the same side of the headache may also accompany the pain. Cluster headaches are believed to be caused by chemical reactions in the brain. They have been described as the most severe and intense of any headache type. Treatment for cluster headache includes prescription medication and oxygen. SINUS HEADACHES Q: What is a sinus headache? What causes it? How can I treat it? A: When a  cavity in the bones of the face and skull (a sinus) becomes inflamed, the inflammation will cause localized pain. This condition is usually the result of an allergic reaction, a tumor, or an infection. If your headache is caused by a sinus blockage, such as an infection, you will probably have a fever. An x-ray will confirm a sinus blockage. Your caregiver's treatment might include antibiotics for the infection, as well as antihistamines or decongestants.  REBOUND HEADACHES Q: What is a rebound headache? What causes it? How can I treat it? A: A pattern of taking acute headache medications too often can lead to a condition known as "rebound headache." A pattern of taking too much headache medication includes taking it more than 2 days per week or in excessive amounts. That means more than the label or a caregiver advises. With rebound headaches, your medications not only stop relieving pain, they actually begin to cause headaches. Doctors treat rebound headache by tapering the medication that is being overused. Sometimes your caregiver will gradually substitute a different type of treatment or medication. Stopping may be a challenge.  Regularly overusing a medication increases the potential for serious side effects. Consult a caregiver if you regularly use headache medications more than 2 days per week or more than the label advises. ADDITIONAL QUESTIONS AND ANSWERS Q: What is biofeedback? A: Biofeedback is a self-help treatment. Biofeedback uses special equipment to monitor your body's involuntary physical responses. Biofeedback monitors:  Breathing.  Pulse.  Heart rate.  Temperature.  Muscle tension.  Brain activity. Biofeedback helps you refine and perfect your relaxation exercises. You learn to control the physical responses that are related to stress. Once the technique has been mastered, you do not need the equipment any more. Q: Are headaches hereditary? A: Four out of five (80%) of people  that suffer report a family history of migraine. Scientists are not sure if this is genetic or a family predisposition. Despite the uncertainty, a child has a 50% chance of having migraine if one parent suffers. The child has a 75% chance if both parents suffer.  Q: Can children get headaches? A: By the time they reach high school, most young people have experienced some type of headache. Many safe and effective approaches or medications can prevent a headache from occurring or stop it after it has begun.  Q: What type of doctor should I see to diagnose and treat my headache? A: Start with your primary caregiver. Discuss his or her experience and approach to headaches. Discuss methods of classification, diagnosis, and treatment. Your caregiver may decide to recommend you to a headache specialist, depending upon your symptoms or other physical conditions. Having diabetes, allergies, etc., may require a more comprehensive and inclusive approach to your headache. The [employer] will provide, upon request, a list of [facility] physician members in your state. Document Released: 02/10/2004 Document Revised: 02/12/2012 Document Reviewed: 07/20/2008 [facility] Patient Information 2015 [place], [place]. This information is not intended to replace advice given to you by your health care provider. Make sure you discuss any questions you have with your health care provider. Hypertension Hypertension, commonly called high blood pressure, is when the force of blood pumping through your arteries is too strong. Your arteries are the blood vessels that carry blood from your heart throughout your body. A blood pressure reading consists of a higher number over a lower number, such as 110/72. The higher number (systolic) is the pressure inside your arteries when your heart pumps. The lower number (diastolic) is the pressure inside your arteries when your heart relaxes. Ideally you want your blood pressure below  120/80. Hypertension forces your heart to work harder to pump blood. Your arteries may become narrow or stiff. Having hypertension puts you at risk for heart disease, stroke, and other problems.  RISK FACTORS Some risk factors for high blood pressure are controllable. Others are not.  Risk factors you cannot control include:   Race. You may be at higher risk if you are African American.  Age. Risk increases with age.  Gender. Men are at higher risk than women before age 28 years. After age 76, women are at higher risk than men. Risk factors you can control include:  Not getting enough exercise or physical activity.  Being overweight.  Getting too much fat, sugar, calories, or salt in your diet.  Drinking too much alcohol. SIGNS AND SYMPTOMS Hypertension does not usually cause signs or symptoms. Extremely high blood pressure (hypertensive crisis) may cause headache, anxiety, shortness of breath, and nosebleed. DIAGNOSIS  To check if you have hypertension, your health care provider will measure  your blood pressure while you are seated, with your arm held at the level of your heart. It should be measured at least twice using the same arm. Certain conditions can cause a difference in blood pressure between your right and left arms. A blood pressure reading that is higher than normal on one occasion does not mean that you need treatment. If one blood pressure reading is high, ask your health care provider about having it checked again. TREATMENT  Treating high blood pressure includes making lifestyle changes and possibly taking medication. Living a healthy lifestyle can help lower high blood pressure. You may need to change some of your habits. Lifestyle changes may include:  Following the DASH diet. This diet is high in fruits, vegetables, and whole grains. It is low in salt, red meat, and added sugars.  Getting at least 2 1/2 hours of brisk physical activity every week.  Losing weight if  necessary.  Not smoking.  Limiting alcoholic beverages.  Learning ways to reduce stress. If lifestyle changes are not enough to get your blood pressure under control, your health care provider may prescribe medicine. You may need to take more than one. Work closely with your health care provider to understand the risks and benefits. HOME CARE INSTRUCTIONS  Have your blood pressure rechecked as directed by your health care provider.   Only take medicine as directed by your health care provider. Follow the directions carefully. Blood pressure medicines must be taken as prescribed. The medicine does not work as well when you skip doses. Skipping doses also puts you at risk for problems.   Do not smoke.   Monitor your blood pressure at home as directed by your health care provider. SEEK MEDICAL CARE IF:   You think you are having a reaction to medicines taken.  You have recurrent headaches or feel dizzy.  You have swelling in your ankles.  You have trouble with your vision. SEEK IMMEDIATE MEDICAL CARE IF:  You develop a severe headache or confusion.  You have unusual weakness, numbness, or feel faint.  You have severe chest or abdominal pain.  You vomit repeatedly.  You have trouble breathing. MAKE SURE YOU:   Understand these instructions.  Will watch your condition.  Will get help right away if you are not doing well or get worse. Document Released: 11/20/2005 Document Revised: 11/25/2013 Document Reviewed: 09/12/2013 [facility] Patient Information 2015 [place], [place]. This information is not intended to replace advice given to you by your health care provider. Make sure you discuss any questions you have with your health care provider.

## 2014-09-04 ENCOUNTER — Encounter (HOSPITAL_BASED_OUTPATIENT_CLINIC_OR_DEPARTMENT_OTHER): Payer: Self-pay | Admitting: Emergency Medicine

## 2014-09-04 ENCOUNTER — Emergency Department (HOSPITAL_BASED_OUTPATIENT_CLINIC_OR_DEPARTMENT_OTHER)
Admission: EM | Admit: 2014-09-04 | Discharge: 2014-09-04 | Disposition: A | Discharge status: Home / Self Care | Payer: Worker's Compensation | Attending: Emergency Medicine | Admitting: Emergency Medicine

## 2014-09-04 ENCOUNTER — Emergency Department (HOSPITAL_BASED_OUTPATIENT_CLINIC_OR_DEPARTMENT_OTHER): Payer: Worker's Compensation

## 2014-09-04 DIAGNOSIS — Z8739 Personal history of other diseases of the musculoskeletal system and connective tissue: Secondary | ICD-10-CM | POA: Insufficient documentation

## 2014-09-04 DIAGNOSIS — Y9289 Other specified places as the place of occurrence of the external cause: Secondary | ICD-10-CM | POA: Insufficient documentation

## 2014-09-04 DIAGNOSIS — Z8701 Personal history of pneumonia (recurrent): Secondary | ICD-10-CM | POA: Insufficient documentation

## 2014-09-04 DIAGNOSIS — Z720 Tobacco use: Secondary | ICD-10-CM | POA: Insufficient documentation

## 2014-09-04 DIAGNOSIS — Z23 Encounter for immunization: Secondary | ICD-10-CM | POA: Insufficient documentation

## 2014-09-04 DIAGNOSIS — Z880 Allergy status to penicillin: Secondary | ICD-10-CM | POA: Insufficient documentation

## 2014-09-04 DIAGNOSIS — S060X0A Concussion without loss of consciousness, initial encounter: Secondary | ICD-10-CM | POA: Insufficient documentation

## 2014-09-04 DIAGNOSIS — Z79899 Other long term (current) drug therapy: Secondary | ICD-10-CM | POA: Diagnosis not present

## 2014-09-04 DIAGNOSIS — Y990 Civilian activity done for income or pay: Secondary | ICD-10-CM | POA: Diagnosis not present

## 2014-09-04 DIAGNOSIS — S0990XA Unspecified injury of head, initial encounter: Secondary | ICD-10-CM | POA: Diagnosis present

## 2014-09-04 DIAGNOSIS — W208XXA Other cause of strike by thrown, projected or falling object, initial encounter: Secondary | ICD-10-CM | POA: Diagnosis not present

## 2014-09-04 MED ORDER — TETANUS-DIPHTH-ACELL PERTUSSIS 5-2.5-18.5 LF-MCG/0.5 IM SUSP
0.5000 mL | Freq: Once | INTRAMUSCULAR | Status: AC
  Administered 2014-09-04: 0.5 mL via INTRAMUSCULAR
  Filled 2014-09-04: qty 0.5

## 2014-09-04 MED ORDER — OXYCODONE-ACETAMINOPHEN 5-325 MG PO TABS: 1.0000 | ORAL_TABLET | ORAL | Status: DC | PRN

## 2014-09-04 MED ORDER — OXYCODONE-ACETAMINOPHEN 5-325 MG PO TABS
1.0000 | ORAL_TABLET | Freq: Once | ORAL | Status: AC
  Administered 2014-09-04: 1 via ORAL
  Filled 2014-09-04: qty 1

## 2014-09-04 NOTE — ED Provider Notes (Signed)
CSN: [id number]     Arrival date & time 09/04/14  1433 History   First MD Initiated Contact with Patient 09/04/14 1506     Chief Complaint  Patient presents with  . Head Injury     (Consider location/radiation/quality/duration/timing/severity/associated sxs/prior Treatment) HPI Comments: Patient presents after sustaining a head injury. 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 There is no loss of consciousness but he was dizzy and had blurry vision for about 30 minutes after the incident. He's very sleepy at this point. His had nausea but no vomiting. He denies any neck or back pain. He denies any numbness or weakness in his extremities. He denies any other injuries. He's had constant throbbing worsening headache since the incident. The headache is made worse by light.  Patient is a 48 y.o. male presenting with head injury.  Head Injury Associated symptoms: headache and nausea   Associated symptoms: no numbness and no vomiting     Past Medical History  Diagnosis Date  . Pneumonia     Hx of 2014  . Numbness and tingling of both legs     Hx: of  . Lumbar herniated disc     Hx of   Past Surgical History  Procedure Laterality Date  . Back surgery    . Orif patella Left 04/26/2013    Procedure: Partial catalectomy with advancement of patella tendon;  Surgeon: Mcarthur Rossetti, MD;  Location: [place];  Service: Orthopedics;  Laterality: Left;  . Knee arthroscopy Left 08/26/2013    Procedure: Manipulation under anesthesia left knee w/ steriiod injection and steroid injection left foot;  Surgeon: Mcarthur Rossetti, MD;  Location: [place];  Service: Orthopedics;  Laterality: Left;   Family History  Problem Relation Age of Onset  . Hypertension Father    History  Substance Use Topics  . Smoking status: Current Every Day Smoker    Types: Cigars  . Smokeless tobacco: Not on file  . Alcohol Use: No    Review of Systems   Constitutional: Negative for fever, chills, diaphoresis and fatigue.  HENT: Negative for congestion, rhinorrhea and sneezing.   Eyes: Positive for visual disturbance.  Respiratory: Negative for cough, chest tightness and shortness of breath.   Cardiovascular: Negative for chest pain and leg swelling.  Gastrointestinal: Positive for nausea. Negative for vomiting, abdominal pain, diarrhea and blood in stool.  Genitourinary: Negative for frequency, hematuria, flank pain and difficulty urinating.  Musculoskeletal: Negative for arthralgias and back pain.  Skin: Negative for rash.  Neurological: Positive for dizziness and headaches. Negative for speech difficulty, weakness and numbness.      Allergies  Penicillins  Home Medications   Prior to Admission medications   Medication Sig Start Date End Date Taking? Authorizing Provider  hydrochlorothiazide (HYDRODIURIL) 25 MG tablet Take 1 tablet (25 mg total) by mouth daily. 06/09/14   Fransico Meadow, PA-C  oxyCODONE-acetaminophen (PERCOCET) 5-325 MG per tablet Take 1-2 tablets by mouth every 4 (four) hours as needed. 09/04/14   Malvin Johns, MD  oxyCODONE-acetaminophen (ROXICET) 5-325 MG per tablet Take 1-2 tablets by mouth every 4 (four) hours as needed for pain. 08/26/13   Mcarthur Rossetti, MD   BP 127/80  Pulse 81  Temp(Src) 98.4 F (36.9 C) (Oral)  Resp 18  Ht 6\' 3"  (1.905 m)  Wt 260 lb (117.935 kg)  BMI 32.50 kg/m2  SpO2 99% Physical Exam  Constitutional: He  is oriented to person, place, and time. He appears well-developed and well-nourished.  HENT:  Head: Normocephalic and atraumatic.  Eyes: Pupils are equal, round, and reactive to light.  Neck: Normal range of motion. Neck supple.  No pain along the spine  Cardiovascular: Normal rate, regular rhythm and normal heart sounds.   Pulmonary/Chest: Effort normal and breath sounds normal. No respiratory distress. He has no wheezes. He has no rales. He exhibits no tenderness.   Abdominal: Soft. Bowel sounds are normal. There is no tenderness. There is no rebound and no guarding.  Musculoskeletal: Normal range of motion. He exhibits no edema.  Lymphadenopathy:    He has no cervical adenopathy.  Neurological: He is alert and oriented to person, place, and time. He has normal strength. No cranial nerve deficit or sensory deficit. GCS eye subscore is 4. GCS verbal subscore is 5. GCS motor subscore is 6.  Skin: Skin is warm and dry. No rash noted.  Psychiatric: He has a normal mood and affect.    ED Course  Procedures (including critical care time) Labs Review Labs Reviewed - No data to display  Imaging Review Ct Head Wo Contrast  09/04/2014   CLINICAL DATA:  Hit on right side of head by a piece of marble earlier today. Dizziness and blurred vision.  EXAM: CT HEAD WITHOUT CONTRAST  TECHNIQUE: Contiguous axial images were obtained from the base of the skull through the vertex without intravenous contrast.  COMPARISON:  06/09/2014  FINDINGS: The ventricles and sulci are within normal limits for age. There is no evidence of acute infarct, intracranial hemorrhage, mass, midline shift, or extra-axial collection. The orbits are unremarkable. Mastoid air cells are clear. There is mild bilateral sphenoid sinus and right posterior ethmoid air cell mucosal thickening. There is no evidence of acute fracture.  IMPRESSION: Unremarkable CT appearance of the brain without evidence of acute intracranial abnormality.   Electronically Signed   By: Logan Bores   On: 09/04/2014 15:34     EKG Interpretation None      MDM   Final diagnoses:  Head injury, closed, with concussion, without loss of consciousness, initial encounter    No evidence of ICH or skull fracture.  Patient is given head injury precautions. He was advised to followup with a primary care physician if his symptoms continue or return here as needed for any worsening symptoms.    Malvin Johns, MD 09/04/14 [id number]

## 2014-09-04 NOTE — ED Notes (Signed)
Pt states he called his supervisor and does not need a uds or bat.

## 2014-09-04 NOTE — Discharge Instructions (Signed)
Concussion  A concussion, or closed-head injury, is a brain injury caused by a direct blow to the head or by a quick and sudden movement (jolt) of the head or neck. Concussions are usually not life-threatening. Even so, the effects of a concussion can be serious. If you have had a concussion before, you are more likely to experience concussion-like symptoms after a direct blow to the head.   CAUSES  · Direct blow to the head, such as from running into another player during a soccer game, being hit in a fight, or hitting your head on a hard surface.  · A jolt of the head or neck that causes the brain to move back and forth inside the skull, such as in a car crash.  SIGNS AND SYMPTOMS  The signs of a concussion can be hard to notice. Early on, they may be missed by you, family members, and health care providers. You may look fine but act or feel differently.  Symptoms are usually temporary, but they may last for days, weeks, or even longer. Some symptoms may appear right away while others may not show up for hours or days. Every head injury is different. Symptoms include:  · Mild to moderate headaches that will not go away.  · A feeling of pressure inside your head.  · Having more trouble than usual:  ¨ Learning or remembering things you have heard.  ¨ Answering questions.  ¨ Paying attention or concentrating.  ¨ Organizing daily tasks.  ¨ Making decisions and solving problems.  · Slowness in thinking, acting or reacting, speaking, or reading.  · Getting lost or being easily confused.  · Feeling tired all the time or lacking energy (fatigued).  · Feeling drowsy.  · Sleep disturbances.  ¨ Sleeping more than usual.  ¨ Sleeping less than usual.  ¨ Trouble falling asleep.  ¨ Trouble sleeping (insomnia).  · Loss of balance or feeling lightheaded or dizzy.  · Nausea or vomiting.  · Numbness or tingling.  · Increased sensitivity to:  ¨ Sounds.  ¨ Lights.  ¨ Distractions.  · Vision problems or eyes that tire  easily.  · Diminished sense of taste or smell.  · Ringing in the ears.  · Mood changes such as feeling sad or anxious.  · Becoming easily irritated or angry for little or no reason.  · Lack of motivation.  · Seeing or hearing things other people do not see or hear (hallucinations).  DIAGNOSIS  Your health care provider can usually diagnose a concussion based on a description of your injury and symptoms. He or she will ask whether you passed out (lost consciousness) and whether you are having trouble remembering events that happened right before and during your injury.  Your evaluation might include:  · A brain scan to look for signs of injury to the brain. Even if the test shows no injury, you may still have a concussion.  · Blood tests to be sure other problems are not present.  TREATMENT  · Concussions are usually treated in an emergency department, in urgent care, or at a clinic. You may need to stay in the hospital overnight for further treatment.  · Tell your health care provider if you are taking any medicines, including prescription medicines, over-the-counter medicines, and natural remedies. Some medicines, such as blood thinners (anticoagulants) and aspirin, may increase the chance of complications. Also tell your health care provider whether you have had alcohol or are taking illegal drugs. This information   may affect treatment.  · Your health care provider will send you home with important instructions to follow.  · How fast you will recover from a concussion depends on many factors. These factors include how severe your concussion is, what part of your brain was injured, your age, and how healthy you were before the concussion.  · Most people with mild injuries recover fully. Recovery can take time. In general, recovery is slower in older persons. Also, persons who have had a concussion in the past or have other medical problems may find that it takes longer to recover from their current injury.  HOME  CARE INSTRUCTIONS  General Instructions  · Carefully follow the directions your health care provider gave you.  · Only take over-the-counter or prescription medicines for pain, discomfort, or fever as directed by your health care provider.  · Take only those medicines that your health care provider has approved.  · Do not drink alcohol until your health care provider says you are well enough to do so. Alcohol and certain other drugs may slow your recovery and can put you at risk of further injury.  · If it is harder than usual to remember things, write them down.  · If you are easily distracted, try to do one thing at a time. For example, do not try to watch TV while fixing dinner.  · Talk with family members or close friends when making important decisions.  · Keep all follow-up appointments. Repeated evaluation of your symptoms is recommended for your recovery.  · Watch your symptoms and tell others to do the same. Complications sometimes occur after a concussion. Older adults with a brain injury may have a higher risk of serious complications, such as a blood clot on the brain.  · Tell your teachers, school nurse, school counselor, coach, athletic trainer, or work manager about your injury, symptoms, and restrictions. Tell them about what you can or cannot do. They should watch for:  ¨ Increased problems with attention or concentration.  ¨ Increased difficulty remembering or learning new information.  ¨ Increased time needed to complete tasks or assignments.  ¨ Increased irritability or decreased ability to cope with stress.  ¨ Increased symptoms.  · Rest. Rest helps the brain to heal. Make sure you:  ¨ Get plenty of sleep at night. Avoid staying up late at night.  ¨ Keep the same bedtime hours on weekends and weekdays.  ¨ Rest during the day. Take daytime naps or rest breaks when you feel tired.  · Limit activities that require a lot of thought or concentration. These include:  ¨ Doing homework or job-related  work.  ¨ Watching TV.  ¨ Working on the computer.  · Avoid any situation where there is potential for another head injury (football, hockey, soccer, basketball, martial arts, downhill snow sports and horseback riding). Your condition will get worse every time you experience a concussion. You should avoid these activities until you are evaluated by the appropriate follow-up health care providers.  Returning To Your Regular Activities  You will need to return to your normal activities slowly, not all at once. You must give your body and brain enough time for recovery.  · Do not return to sports or other athletic activities until your health care provider tells you it is safe to do so.  · Ask your health care provider when you can drive, ride a bicycle, or operate heavy machinery. Your ability to react may be slower after a   brain injury. Never do these activities if you are dizzy.  · Ask your health care provider about when you can return to work or school.  Preventing Another Concussion  It is very important to avoid another brain injury, especially before you have recovered. In rare cases, another injury can lead to permanent brain damage, brain swelling, or death. The risk of this is greatest during the first 7-10 days after a head injury. Avoid injuries by:  · Wearing a seat belt when riding in a car.  · Drinking alcohol only in moderation.  · Wearing a helmet when biking, skiing, skateboarding, skating, or doing similar activities.  · Avoiding activities that could lead to a second concussion, such as contact or recreational sports, until your health care provider says it is okay.  · Taking safety measures in your home.  ¨ Remove clutter and tripping hazards from floors and stairways.  ¨ Use grab bars in bathrooms and handrails by stairs.  ¨ Place non-slip mats on floors and in bathtubs.  ¨ Improve lighting in dim areas.  SEEK MEDICAL CARE IF:  · You have increased problems paying attention or  concentrating.  · You have increased difficulty remembering or learning new information.  · You need more time to complete tasks or assignments than before.  · You have increased irritability or decreased ability to cope with stress.  · You have more symptoms than before.  Seek medical care if you have any of the following symptoms for more than 2 weeks after your injury:  · Lasting (chronic) headaches.  · Dizziness or balance problems.  · Nausea.  · Vision problems.  · Increased sensitivity to noise or light.  · Depression or mood swings.  · Anxiety or irritability.  · Memory problems.  · Difficulty concentrating or paying attention.  · Sleep problems.  · Feeling tired all the time.  SEEK IMMEDIATE MEDICAL CARE IF:  · You have severe or worsening headaches. These may be a sign of a blood clot in the brain.  · You have weakness (even if only in one hand, leg, or part of the face).  · You have numbness.  · You have decreased coordination.  · You vomit repeatedly.  · You have increased sleepiness.  · One pupil is larger than the other.  · You have convulsions.  · You have slurred speech.  · You have increased confusion. This may be a sign of a blood clot in the brain.  · You have increased restlessness, agitation, or irritability.  · You are unable to recognize people or places.  · You have neck pain.  · It is difficult to wake you up.  · You have unusual behavior changes.  · You lose consciousness.  MAKE SURE YOU:  · Understand these instructions.  · Will watch your condition.  · Will get help right away if you are not doing well or get worse.  Document Released: 02/10/2004 Document Revised: 11/25/2013 Document Reviewed: 06/12/2013  [employer]® Patient Information ©2015 [employer]. This information is not intended to replace advice given to you by your health care provider. Make sure you discuss any questions you have with your health care provider.

## 2014-09-04 NOTE — ED Notes (Signed)
Crate at work feel on top of head approx 30 min PTA-no LOC

## 2015-12-26 ENCOUNTER — Encounter (HOSPITAL_BASED_OUTPATIENT_CLINIC_OR_DEPARTMENT_OTHER): Payer: Self-pay | Admitting: *Deleted

## 2015-12-26 ENCOUNTER — Emergency Department (HOSPITAL_BASED_OUTPATIENT_CLINIC_OR_DEPARTMENT_OTHER)
Admission: EM | Admit: 2015-12-26 | Discharge: 2015-12-26 | Disposition: A | Discharge status: Home / Self Care | Payer: Self-pay | Attending: Emergency Medicine | Admitting: Emergency Medicine

## 2015-12-26 ENCOUNTER — Emergency Department (HOSPITAL_BASED_OUTPATIENT_CLINIC_OR_DEPARTMENT_OTHER): Payer: Self-pay

## 2015-12-26 DIAGNOSIS — T148XXA Other injury of unspecified body region, initial encounter: Secondary | ICD-10-CM

## 2015-12-26 DIAGNOSIS — K5900 Constipation, unspecified: Secondary | ICD-10-CM | POA: Insufficient documentation

## 2015-12-26 DIAGNOSIS — J069 Acute upper respiratory infection, unspecified: Secondary | ICD-10-CM | POA: Insufficient documentation

## 2015-12-26 DIAGNOSIS — Z8701 Personal history of pneumonia (recurrent): Secondary | ICD-10-CM | POA: Insufficient documentation

## 2015-12-26 DIAGNOSIS — R05 Cough: Secondary | ICD-10-CM

## 2015-12-26 DIAGNOSIS — F17210 Nicotine dependence, cigarettes, uncomplicated: Secondary | ICD-10-CM | POA: Insufficient documentation

## 2015-12-26 DIAGNOSIS — Z79899 Other long term (current) drug therapy: Secondary | ICD-10-CM | POA: Insufficient documentation

## 2015-12-26 DIAGNOSIS — M7981 Nontraumatic hematoma of soft tissue: Secondary | ICD-10-CM | POA: Insufficient documentation

## 2015-12-26 DIAGNOSIS — R1012 Left upper quadrant pain: Secondary | ICD-10-CM | POA: Insufficient documentation

## 2015-12-26 DIAGNOSIS — Z8781 Personal history of (healed) traumatic fracture: Secondary | ICD-10-CM | POA: Insufficient documentation

## 2015-12-26 DIAGNOSIS — Z880 Allergy status to penicillin: Secondary | ICD-10-CM | POA: Insufficient documentation

## 2015-12-26 DIAGNOSIS — R059 Cough, unspecified: Secondary | ICD-10-CM

## 2015-12-26 LAB — COMPREHENSIVE METABOLIC PANEL
ALT: 26 U/L (ref 17–63)
AST: 27 U/L (ref 15–41)
Albumin: 3.9 g/dL (ref 3.5–5.0)
Alkaline Phosphatase: 115 U/L (ref 38–126)
Anion gap: 7 (ref 5–15)
BILIRUBIN TOTAL: 0.5 mg/dL (ref 0.3–1.2)
BUN: 12 mg/dL (ref 6–20)
CHLORIDE: 104 mmol/L (ref 101–111)
CO2: 27 mmol/L (ref 22–32)
Calcium: 9 mg/dL (ref 8.9–10.3)
Creatinine, Ser: 0.92 mg/dL (ref 0.61–1.24)
GFR calc Af Amer: >60 mL/min (ref >60)
Glucose, Bld: 134 mg/dL — ABNORMAL HIGH (ref 65–99)
Potassium: 4 mmol/L (ref 3.5–5.1)
SODIUM: 138 mmol/L (ref 135–145)
TOTAL PROTEIN: 7.6 g/dL (ref 6.5–8.1)

## 2015-12-26 LAB — CBC WITH DIFFERENTIAL/PLATELET
BASOS PCT: 0 %
Basophils Absolute: 0 10*3/uL (ref 0.0–0.1)
Eosinophils Absolute: 0.3 10*3/uL (ref 0.0–0.7)
Eosinophils Relative: 2 %
HCT: 45.3 % (ref 39.0–52.0)
Hemoglobin: 15.6 g/dL (ref 13.0–17.0)
LYMPHS ABS: 4.3 10*3/uL — AB (ref 0.7–4.0)
Lymphocytes Relative: 32 %
MCH: 30.6 pg (ref 26.0–34.0)
MCHC: 34.4 g/dL (ref 30.0–36.0)
MCV: 89 fL (ref 78.0–100.0)
Monocytes Absolute: 1 10*3/uL (ref 0.1–1.0)
Monocytes Relative: 8 %
NEUTROS PCT: 58 %
Neutro Abs: 7.8 10*3/uL — ABNORMAL HIGH (ref 1.7–7.7)
PLATELETS: 314 10*3/uL (ref 150–400)
RBC: 5.09 MIL/uL (ref 4.22–5.81)
RDW: 12.4 % (ref 11.5–15.5)
WBC: 13.4 10*3/uL — AB (ref 4.0–10.5)

## 2015-12-26 LAB — LIPASE, BLOOD: LIPASE: 27 U/L (ref 11–51)

## 2015-12-26 MED ORDER — IOHEXOL 300 MG/ML  SOLN
25.0000 mL | Freq: Once | INTRAMUSCULAR | Status: AC | PRN
  Administered 2015-12-26: 25 mL via ORAL

## 2015-12-26 MED ORDER — OXYCODONE-ACETAMINOPHEN 5-325 MG PO TABS
2.0000 | ORAL_TABLET | Freq: Once | ORAL | Status: AC
Start: 2015-12-26 — End: 2015-12-26
  Administered 2015-12-26: 2 via ORAL
  Filled 2015-12-26: qty 2

## 2015-12-26 MED ORDER — OXYCODONE-ACETAMINOPHEN 5-325 MG PO TABS: 1.0000 | ORAL_TABLET | ORAL | Status: DC | PRN

## 2015-12-26 MED ORDER — BENZONATATE 100 MG PO CAPS: 100.0000 mg | ORAL_CAPSULE | Freq: Three times a day (TID) | ORAL | Status: DC

## 2015-12-26 MED ORDER — IOHEXOL 350 MG/ML SOLN
85.0000 mL | Freq: Once | INTRAVENOUS | Status: AC | PRN
  Administered 2015-12-26: 100 mL via INTRAVENOUS

## 2015-12-26 MED ORDER — SODIUM CHLORIDE 0.9 % IV BOLUS (SEPSIS)
1000.0000 mL | Freq: Once | INTRAVENOUS | Status: AC
  Administered 2015-12-26: 1000 mL via INTRAVENOUS

## 2015-12-26 MED ORDER — ONDANSETRON HCL 4 MG/2ML IJ SOLN
4.0000 mg | Freq: Once | INTRAMUSCULAR | Status: AC
  Administered 2015-12-26: 4 mg via INTRAVENOUS
  Filled 2015-12-26: qty 2

## 2015-12-26 MED ORDER — HYDROMORPHONE HCL 1 MG/ML IJ SOLN
1.0000 mg | Freq: Once | INTRAMUSCULAR | Status: AC
  Administered 2015-12-26: 1 mg via INTRAVENOUS
  Filled 2015-12-26: qty 1

## 2015-12-26 NOTE — ED Provider Notes (Signed)
CSN: [id number]     Arrival date & time 12/26/15  [id number] History   None    Chief Complaint  Patient presents with  . Abdominal Pain     (Consider location/radiation/quality/duration/timing/severity/associated sxs/prior Treatment) HPI Comments: 2days of LUQ pain Coughing, nonproductive 6AM suddenly worsened Burning pain, severe 10/10 No n/v/diarrhea/fevers Used to drink etoh regularly, last 4 years less    Past Medical History  Diagnosis Date  . Pneumonia     Hx of 2014  . Numbness and tingling of both legs     Hx: of  . Lumbar herniated disc     Hx of   Past Surgical History  Procedure Laterality Date  . Back surgery    . Orif patella Left 04/26/2013    Procedure: Partial catalectomy with advancement of patella tendon;  Surgeon: Mcarthur Rossetti, MD;  Location: [facility];  Service: Orthopedics;  Laterality: Left;  . Knee arthroscopy Left 08/26/2013    Procedure: Manipulation under anesthesia left knee w/ steriiod injection and steroid injection left foot;  Surgeon: Mcarthur Rossetti, MD;  Location: [facility];  Service: Orthopedics;  Laterality: Left;   Family History  Problem Relation Age of Onset  . Hypertension Father    Social History  Substance Use Topics  . Smoking status: Current Every Day Smoker    Types: Cigars  . Smokeless tobacco: None  . Alcohol Use: No    Review of Systems  Constitutional: Negative for fever.  HENT: Negative for sore throat.   Eyes: Negative for visual disturbance.  Respiratory: Negative for shortness of breath.   Cardiovascular: Negative for chest pain.  Gastrointestinal: Positive for constipation. Negative for nausea, vomiting, abdominal pain and diarrhea.  Genitourinary: Negative for hematuria and difficulty urinating.  Musculoskeletal: Negative for back pain and neck stiffness.  Skin: Negative for rash.  Neurological: Negative for syncope and headaches.      Allergies  Penicillins  Home Medications   Prior to  Admission medications   Medication Sig Start Date End Date Taking? Authorizing Provider  benzonatate (TESSALON) 100 MG capsule Take 1 capsule (100 mg total) by mouth every 8 (eight) hours. 12/26/15   Gareth Morgan, MD  hydrochlorothiazide (HYDRODIURIL) 25 MG tablet Take 1 tablet (25 mg total) by mouth daily. 06/09/14   Fransico Meadow, PA-C  oxyCODONE-acetaminophen (PERCOCET/ROXICET) 5-325 MG tablet Take 1-2 tablets by mouth every 4 (four) hours as needed for severe pain. 12/26/15   Gareth Morgan, MD   BP 118/64 mmHg  Pulse 70  Temp(Src) 98.2 F (36.8 C) (Oral)  Resp 17  Ht 6\' 3"  (1.905 m)  Wt 300 lb (136.079 kg)  BMI 37.50 kg/m2  SpO2 98% Physical Exam  Constitutional: He is oriented to person, place, and time. He appears well-developed and well-nourished. No distress.  HENT:  Head: Normocephalic and atraumatic.  Eyes: Conjunctivae and EOM are normal.  Neck: Normal range of motion.  Cardiovascular: Normal rate, regular rhythm, normal heart sounds and intact distal pulses.  Exam reveals no gallop and no friction rub.   No murmur heard. Pulmonary/Chest: Effort normal and breath sounds normal. No respiratory distress. He has no wheezes. He has no rales.  Abdominal: Soft. He exhibits no distension. There is splenomegaly. There is tenderness in the left upper quadrant. There is guarding.  Musculoskeletal: He exhibits no edema.  Neurological: He is alert and oriented to person, place, and time.  Skin: Skin is warm and dry. He is not diaphoretic.  Nursing note and vitals reviewed.  ED Course  Procedures (including critical care time) Labs Review Labs Reviewed  CBC WITH DIFFERENTIAL/PLATELET - Abnormal; Notable for the following:    WBC 13.4 (*)    Neutro Abs 7.8 (*)    Lymphs Abs 4.3 (*)    All other components within normal limits  COMPREHENSIVE METABOLIC PANEL - Abnormal; Notable for the following:    Glucose, Bld 134 (*)    All other components within normal limits  LIPASE,  BLOOD    Imaging Review Dg Chest 2 View  12/26/2015  CLINICAL DATA:  Cough with left-sided pain. EXAM: CHEST - 2 VIEW COMPARISON:  04/19/2013 FINDINGS: The heart size and mediastinal contours are within normal limits. There is some scattered scarring and atelectasis in both lungs. There is no evidence of pulmonary edema, consolidation, pneumothorax, nodule or pleural fluid. The visualized skeletal structures are unremarkable. IMPRESSION: No active disease. Electronically Signed   By: Aletta Edouard M.D.   On: 12/26/2015 08:20   Ct Abdomen Pelvis W Contrast  12/26/2015  CLINICAL DATA:  Left lower quadrant pain for 2 days EXAM: CT ABDOMEN AND PELVIS WITH CONTRAST TECHNIQUE: Multidetector CT imaging of the abdomen and pelvis was performed using the standard protocol following bolus administration of intravenous contrast. CONTRAST:  162mL OMNIPAQUE IOHEXOL 350 MG/ML SOLN, 58mL OMNIPAQUE IOHEXOL 300 MG/ML SOLN COMPARISON:  None. FINDINGS: Lung bases are free of acute infiltrate or sizable effusion. Contrast is noted in the distal esophagus which may be related to reflux. The liver, gallbladder, spleen, adrenal glands and pancreas are all normal in their CT appearance. The kidneys are well visualized bilaterally and demonstrate a normal enhancement pattern. Normal excretion of contrast is noted. No renal calculi or obstructive changes are seen. The appendix is within normal limits. No significant diverticular change is seen. The bladder is partially distended. No pelvic mass lesion or sidewall abnormality is noted. The bony structures are within normal limits. In the left rectus muscle superiorly, there is a rectus muscle hematoma with evidence of some recent hemorrhage centrally. This measures approximately 12.6 x 6.1 cm in greatest transverse and AP dimensions respectively. It extends for approximately 12 cm in the craniocaudad projection. These changes correspond with the abnormality on physical exam.  IMPRESSION: Left rectus hematoma with evidence of contrast within indicating an acute/subacute degree of hemorrhage. No other focal abnormality is noted. Electronically Signed   By: Inez Catalina M.D.   On: 12/26/2015 08:50   I have personally reviewed and evaluated these images and lab results as part of my medical decision-making.   EKG Interpretation None      MDM   Final diagnoses:  Hematoma of muscle, left rectus hematoma  Cough  URI (upper respiratory infection)  Left upper quadrant pain   50 year old male with a history of patellar fracture, left knee ankylosis presents with concern of abdominal pain.  DDx includes splenic rupture, perforated bowel, AAA, pancreatitis, pneumonia.  [place] attempted but limited views. Patient hemodynamically stable and will await Cr prior to obtaining CT abdomen with IV contrast.  CT abdomen obtained showing left rectus hematoma with evidence of contrast within indicating acute/subacute hemorrhage.  Patient remains hemodynamically stable. Doubt continuing active extravasation. Recommend pressure over area, ice, pain control.  Given dilaudid IV in ED and percocet.  CXR obtained and shows no signs of pneumonia. Patient likely with a viral URI with cough causing rectus hematoma.  Discussed use of narcotics, association with constipation, recommend miralax and stool softeners. Gave rx for tessalon pearls and percocet  and recommend PCP follow up. Patient discharged in stable condition with understanding of reasons to return.   Gareth Morgan, MD 12/26/15 1050

## 2015-12-26 NOTE — Discharge Instructions (Signed)
°Emergency Department Resource Guide °1) Find a Doctor and Pay Out of Pocket °Although you won't have to find out who is covered by your insurance plan, it is a good idea to ask around and get recommendations. You will then need to call the office and see if the doctor you have chosen will accept you as a new patient and what types of options they offer for patients who are self-pay. Some doctors offer discounts or will set up payment plans for their patients who do not have insurance, but you will need to ask so you aren't surprised when you get to your appointment. ° °2) Contact Your Local Health Department °Not all health departments have doctors that can see patients for sick visits, but many do, so it is worth a call to see if yours does. If you don't know where your local health department is, you can check in your phone book. The CDC also has a tool to help you locate your state's health department, and many state websites also have listings of all of their local health departments. ° °3) Find a Walk-in Clinic °If your illness is not likely to be very severe or complicated, you may want to try a walk in clinic. These are popping up all over the country in pharmacies, drugstores, and shopping centers. They're usually staffed by nurse practitioners or physician assistants that have been trained to treat common illnesses and complaints. They're usually fairly quick and inexpensive. However, if you have serious medical issues or chronic medical problems, these are probably not your best option. ° °No Primary Care Doctor: °- Call [facility] at  832-8000 - they can help you locate a primary care doctor that  accepts your insurance, provides certain services, etc. °- Physician Referral Service- 1-800-533-3463 ° °Chronic Pain Problems: °Organization         Address  Phone   Notes  °[facility]  (336) [phone] Patients need to be referred by their primary care doctor.  ° °Medication  Assistance: °Organization         Address  Phone   Notes  °[place] [facility] [address] °[place], [place] [address] (336) 641-8030 --Must be a resident of [place] °-- Must have NO insurance coverage whatsoever (no Medicaid/ Medicare, etc.) °-- The pt. MUST have a primary care doctor that directs their care regularly and follows them in the community °  °[facility]  (866) 331-1348   °[employer]  (888) 892-1162   ° °Agencies that provide inexpensive medical care: °Organization         Address  Phone   Notes  °[facility]  (336) 832-8035   °[facility]    (336) 832-7272   °[facility] [address] °[place], [place] [address] (336) 832-4777   °[facility] [address], °[place] (336) 271-4999   °[facility]    (336) 373-0678   °[facility]    (336) 272-1050   °[facility] ° [address], [place] Phone:  (336) 832-4444, Fax:  (336) 832-4440 Hours of Operation:  9 am - 6 pm, M-F.  Also accepts Medicaid/Medicare and self-pay.  °[facility] ° [address], [place] Phone: (336) 832-3150, Fax: (336) [phone]. Hours of Operation:  8:30 am - 5:30 pm, M-F.  Also accepts Medicaid and self-pay.  °[facility] [place] [address], [place] Phone: (336) 878-6027   °[facility] [address], [place], [place] (336)723-1848, Ext. 123 Mondays & Thursdays: 7-9 AM.  First 15 patients are seen on a first come, first serve basis. °  ° °Medicaid-accepting [place] Providers: ° °Organization         Address  Phone   Notes  °[facility] [address], [place] (336) 641-2100 Also accepts self-pay patients.  °[facility] [address], [place] ° (336) 856-9996   °[facility] [address], [place]  (336) 288-8857   °[facility] [address] [place] [address], [place] (336) 299-7000   °Veita Bland [address], [place]  ° (336) 373-1557 Only accepts [place] Access Medicaid patients after they have their name applied to their card.  ° °Self-Pay (no insurance) in [place]: ° °Organization         Address  Phone   Notes  °Sickle Cell Patients, [facility] [address], [place] (336) 832-1970   °[facility] [address], [place] (336) 832-4400   °[facility] ° [address], [place] (336) 992-4800   °[facility]/Dr. Osei-Bonsu ° [address] [place] [address], [place] or [address], [place] (336) [phone] Phone number for both [place] and [place] locations is the same.  °[facility] [address], [place] (336) 299-0000   °[facility] [address] [place] [address], [place] or [address] (336) 852-7530 °(336) 878-2260   °[facility] [address], [place] (336) 350-1642, phone; (336) 294-5005, fax Sees patients 1st and 3rd Saturday of every month.  Must not qualify for public or private insurance (i.e. Medicaid, Medicare, [place] Health Choice, Veterans' Benefits) • Household income should be no more than 200% of the poverty level •The clinic cannot treat you if you are pregnant or think you are pregnant • Sexually transmitted diseases are not treated at the clinic.  ° ° °Dental Care: °Organization         Address  Phone  Notes  °[place] [facility] [address], [place] (336) 641-6152 Accepts children up to age 21 who are enrolled in Medicaid or [place] Health Choice; pregnant women with a Medicaid card; and children who have applied for Medicaid or [place] Health Choice, but were declined, whose parents can pay a reduced fee at time of service.  °[place]  [facility] [place]  [address], [place] (336) 641-7733 Accepts children up to age 21 who are enrolled in Medicaid or [place] Health Choice; pregnant women with a Medicaid card; and children who have applied for Medicaid or [place] Health Choice, but were declined, whose parents can pay a reduced fee at time of service.  °[facility] ° [address], [place] (336) 641-4533 Patients are seen by appointment only. Walk-ins are not accepted. [facility] will see patients 18 years of age and older. °Monday - Tuesday (8am-5pm) °Most Wednesdays (8:30-5pm) °$30 per visit, cash only  °[facility] ° [address], [place] (336) 641-4533 Patients are seen by appointment only. Walk-ins are not accepted. [facility] will see patients 18 years of age and older. °One   Wednesday Evening (Monthly: Volunteer Based).  $30 per visit, cash only  °[facility]  (919) 537-3737 for adults; Children under age 4, call Graduate Pediatric Dentistry at ([phone]) [phone]. Children aged 4-14, please call (919) [phone] to request a pediatric application. ° Dental services are provided in all areas of dental care including fillings, crowns and bridges, complete and partial dentures, implants, gum treatment, root canals, and extractions. Preventive care is also provided. Treatment is provided to both adults and children. °Patients are selected via a lottery and there is often a waiting list. °  °[facility] [address], °[place] ° (336) 763-8833 [email or web address] °  °[facility] [address], [place], [place] (336)723-1848, Ext. 123 Second and Fourth Thursday of each month, opens at 6:30 AM; Clinic ends at 9 AM.  Patients are seen on a first-come first-served basis, and a limited number are seen during each clinic.  ° °[facility] ° [address], [place], [place] (336) 723-7904    Eligibility Requirements °You must have lived in [place], [place], or [place] counties for at least the last three months. °  You cannot be eligible for state or federal sponsored healthcare insurance, including Veterans Administration, Medicaid, or Medicare. °  You generally cannot be eligible for healthcare insurance through your employer.  °  How to apply: °Eligibility screenings are held every Tuesday and Wednesday afternoon from 1:00 pm until 4:00 pm. You do not need an appointment for the interview!  °[facility] [address], [place], [place] 336-631-2330   °[facility] [facility]  336-342-8273   °[facility]  336-703-3100   °[facility]  336-570-6415   ° °Behavioral Health Resources in the Community: °Intensive Outpatient Programs °Organization         Address  Phone  Notes  °[facility] [address], [place], [place] 336-878-6098   °[facility] [address], [place], [place] 336-832-9800   °[facility] [address], [place], [place] ° 336-882-2125   °[facility] [address],  °[place], [place] 1-800-853-5163 or 336-641-4981   °Substance Abuse Resources °Organization         Address  Phone  Notes  °[facility]  336-882-2125   °[facility]  336-784-9470   °[facility]  336-285-9073   °[facility]  336-845-3988   °Residential & Outpatient Substance Abuse Program  1-800-659-3381   °Psychological Services °Organization         Address  Phone  Notes  °[facility]  336- 832-9600   °[facility]  336- 378-7881   °[facility] [address], [place] 1-800-853-5163 or 336-641-4981   ° °Mobile Crisis Teams °Organization         Address  Phone  Notes  °[facility], Mobile Crisis Care Unit  1-877-626-1772   °Assertive °[facility] ° [address]  [place], [place] 336-834-9664   °Sharon DeEsch [address] °[place] 336-554-5454   ° °Self-Help/Support Groups °Organization         Address  Phone             Notes  °[facility] - variety of support groups  336- 373-1402 Call for more information  °Narcotics Anonymous (NA), [facility] [address], °[place]   2 meetings at this location  ° °  Residential Treatment Programs °Organization         Address  Phone  Notes  °[facility] [address],    °[place]  1-866-801-8205   °[facility] ° [address], [place], [place] 704-293-8524   °[facility] [address], [place] 336-845-3988 Admissions: 8am-3pm M-F  °[facility] [address]    °[place], [place] 336-841-1104   °[facility] [address], [place], [place] 336-379-7146   °[facility] [address]  °[place], [place] 336-285-9073   °[facility] - Intensive Outpatient [address], [place], [place] 336-852-3033   °[facility] [address]  °[place], [place] 1-877-615-2722 or 336-784-9470   °[facility] ([facility] [address] , [place] 336-227-7417 Accepts Medicaid  °[facility] [address]  ° [place] 1-800-659-3381 Substance Abuse/Addiction Treatment  ° °[place] Behavioral Health Resources °Organization         Address  Phone  Notes  °[facility]  (888) 581-9988   °Julie Brannon, PhD [address], [place]   (336) 349-5553 or (336) 951-0000   °[facility]   [address] °[place], [place] (336) 349-4454   °[facility] [address], [place], [place] (336) 342-8316 Insurance/Medicaid/sponsorship through [facility]  °[facility] [address]                                    [place], [place] (336) 342-8316 Therapy/tele-psych/case    °[facility] [address]  ° [place], [place] (336) 349-2233    °Dr. Arfeen  (336) 349-4544   °[facility] [place]  [employer] [place] [facility] 1) [address], [place] °2) [address], [place] °3)  [address], [place] (336) 349-3220 °(336) 342-7768 ° °(336) 342-8140   °[place] [facility] (336) 342-1394 or (336) 342-3537 (After Hours)    ° ° °

## 2015-12-26 NOTE — ED Notes (Signed)
Patient c/o left lower severe abd pain that started at 6am this morning. Hs grown worse over the past two days

## 2015-12-31 ENCOUNTER — Encounter (HOSPITAL_BASED_OUTPATIENT_CLINIC_OR_DEPARTMENT_OTHER): Payer: Self-pay | Admitting: *Deleted

## 2015-12-31 ENCOUNTER — Emergency Department (HOSPITAL_BASED_OUTPATIENT_CLINIC_OR_DEPARTMENT_OTHER): Payer: Self-pay

## 2015-12-31 ENCOUNTER — Emergency Department (HOSPITAL_BASED_OUTPATIENT_CLINIC_OR_DEPARTMENT_OTHER)
Admission: EM | Admit: 2015-12-31 | Discharge: 2015-12-31 | Disposition: A | Discharge status: Home / Self Care | Payer: Self-pay | Attending: Emergency Medicine | Admitting: Emergency Medicine

## 2015-12-31 DIAGNOSIS — Z8701 Personal history of pneumonia (recurrent): Secondary | ICD-10-CM | POA: Insufficient documentation

## 2015-12-31 DIAGNOSIS — Z880 Allergy status to penicillin: Secondary | ICD-10-CM | POA: Insufficient documentation

## 2015-12-31 DIAGNOSIS — J40 Bronchitis, not specified as acute or chronic: Secondary | ICD-10-CM

## 2015-12-31 DIAGNOSIS — S301XXD Contusion of abdominal wall, subsequent encounter: Secondary | ICD-10-CM | POA: Insufficient documentation

## 2015-12-31 DIAGNOSIS — X58XXXD Exposure to other specified factors, subsequent encounter: Secondary | ICD-10-CM | POA: Insufficient documentation

## 2015-12-31 DIAGNOSIS — J209 Acute bronchitis, unspecified: Secondary | ICD-10-CM | POA: Insufficient documentation

## 2015-12-31 DIAGNOSIS — Z8739 Personal history of other diseases of the musculoskeletal system and connective tissue: Secondary | ICD-10-CM | POA: Insufficient documentation

## 2015-12-31 DIAGNOSIS — Z79899 Other long term (current) drug therapy: Secondary | ICD-10-CM | POA: Insufficient documentation

## 2015-12-31 DIAGNOSIS — F17210 Nicotine dependence, cigarettes, uncomplicated: Secondary | ICD-10-CM | POA: Insufficient documentation

## 2015-12-31 LAB — COMPREHENSIVE METABOLIC PANEL
ALT: 23 U/L (ref 17–63)
ANION GAP: 9 (ref 5–15)
AST: 21 U/L (ref 15–41)
Albumin: 3.7 g/dL (ref 3.5–5.0)
Alkaline Phosphatase: 101 U/L (ref 38–126)
BILIRUBIN TOTAL: 0.6 mg/dL (ref 0.3–1.2)
BUN: 13 mg/dL (ref 6–20)
CHLORIDE: 104 mmol/L (ref 101–111)
CO2: 23 mmol/L (ref 22–32)
Calcium: 8.6 mg/dL — ABNORMAL LOW (ref 8.9–10.3)
Creatinine, Ser: 0.84 mg/dL (ref 0.61–1.24)
GFR calc Af Amer: >60 mL/min (ref >60)
Glucose, Bld: 121 mg/dL — ABNORMAL HIGH (ref 65–99)
POTASSIUM: 3.6 mmol/L (ref 3.5–5.1)
Sodium: 136 mmol/L (ref 135–145)
TOTAL PROTEIN: 7.4 g/dL (ref 6.5–8.1)

## 2015-12-31 LAB — CBC WITH DIFFERENTIAL/PLATELET
BASOS ABS: 0.1 10*3/uL (ref 0.0–0.1)
Basophils Relative: 1 %
EOS PCT: 0 %
Eosinophils Absolute: 0 10*3/uL (ref 0.0–0.7)
HEMATOCRIT: 39.1 % (ref 39.0–52.0)
HEMOGLOBIN: 13.2 g/dL (ref 13.0–17.0)
LYMPHS ABS: 4.8 10*3/uL — AB (ref 0.7–4.0)
LYMPHS PCT: 37 %
MCH: 30.2 pg (ref 26.0–34.0)
MCHC: 33.8 g/dL (ref 30.0–36.0)
MCV: 89.5 fL (ref 78.0–100.0)
Monocytes Absolute: 0.5 10*3/uL (ref 0.1–1.0)
Monocytes Relative: 4 %
NEUTROS PCT: 58 %
Neutro Abs: 7.5 10*3/uL (ref 1.7–7.7)
Platelets: 315 10*3/uL (ref 150–400)
RBC: 4.37 MIL/uL (ref 4.22–5.81)
RDW: 12.7 % (ref 11.5–15.5)
WBC: 12.9 10*3/uL — AB (ref 4.0–10.5)

## 2015-12-31 LAB — LIPASE, BLOOD: Lipase: 19 U/L (ref 11–51)

## 2015-12-31 MED ORDER — ABDOMINAL BINDER/ELASTIC 3XL MISC: 1.0000 | Status: DC

## 2015-12-31 MED ORDER — GUAIFENESIN 200 MG PO TABS
200.0000 mg | ORAL_TABLET | Freq: Once | ORAL | Status: DC
  Filled 2015-12-31: qty 1

## 2015-12-31 MED ORDER — GUAIFENESIN-CODEINE 100-10 MG/5ML PO SYRP: 5.0000 mL | ORAL_SOLUTION | Freq: Three times a day (TID) | ORAL | Status: DC | PRN

## 2015-12-31 MED ORDER — GUAIFENESIN 100 MG/5ML PO SOLN
ORAL | Status: AC
  Administered 2015-12-31: 200 mg
  Filled 2015-12-31: qty 10

## 2015-12-31 MED FILL — CHERATUSSIN AC SYRUP: 100-10 | 8 days supply | Qty: 120 | Fill #0

## 2015-12-31 NOTE — ED Notes (Signed)
Did not have an Abd binder big enough for the patient.  [facility] and they have no way of transferring one at this time.  Spoke with MD and she stated that she would write a Rx for the binder.

## 2015-12-31 NOTE — Discharge Instructions (Signed)
Get rechecked if you develop fevers, worsening pain, or new concerning symptoms.     Upper Respiratory Infection, Adult Most upper respiratory infections (URIs) are a viral infection of the air passages leading to the lungs. A URI affects the nose, throat, and upper air passages. The most common type of URI is nasopharyngitis and is typically referred to as "the common cold." URIs run their course and usually go away on their own. Most of the time, a URI does not require medical attention, but sometimes a bacterial infection in the upper airways can follow a viral infection. This is called a secondary infection. Sinus and middle ear infections are common types of secondary upper respiratory infections. Bacterial pneumonia can also complicate a URI. A URI can worsen asthma and chronic obstructive pulmonary disease (COPD). Sometimes, these complications can require emergency medical care and may be life threatening.  CAUSES Almost all URIs are caused by viruses. A virus is a type of germ and can spread from one person to another.  RISKS FACTORS You may be at risk for a URI if:   You smoke.   You have chronic heart or lung disease.  You have a weakened defense (immune) system.   You are very young or very old.   You have nasal allergies or asthma.  You work in crowded or poorly ventilated areas.  You work in health care facilities or schools. SIGNS AND SYMPTOMS  Symptoms typically develop 2-3 days after you come in contact with a cold virus. Most viral URIs last 7-10 days. However, viral URIs from the influenza virus (flu virus) can last 14-18 days and are typically more severe. Symptoms may include:   Runny or stuffy (congested) nose.   Sneezing.   Cough.   Sore throat.   Headache.   Fatigue.   Fever.   Loss of appetite.   Pain in your forehead, behind your eyes, and over your cheekbones (sinus pain).  Muscle aches.  DIAGNOSIS  Your health care provider may  diagnose a URI by:  Physical exam.  Tests to check that your symptoms are not due to another condition such as:  Strep throat.  Sinusitis.  Pneumonia.  Asthma. TREATMENT  A URI goes away on its own with time. It cannot be cured with medicines, but medicines may be prescribed or recommended to relieve symptoms. Medicines may help:  Reduce your fever.  Reduce your cough.  Relieve nasal congestion. HOME CARE INSTRUCTIONS   Take medicines only as directed by your health care provider.   Gargle warm saltwater or take cough drops to comfort your throat as directed by your health care provider.  Use a warm mist humidifier or inhale steam from a shower to increase air moisture. This may make it easier to breathe.  Drink enough fluid to keep your urine clear or pale yellow.   Eat soups and other clear broths and maintain good nutrition.   Rest as needed.   Return to work when your temperature has returned to normal or as your health care provider advises. You may need to stay home longer to avoid infecting others. You can also use a face mask and careful hand washing to prevent spread of the virus.  Increase the usage of your inhaler if you have asthma.   Do not use any tobacco products, including cigarettes, chewing tobacco, or electronic cigarettes. If you need help quitting, ask your health care provider. PREVENTION  The best way to protect yourself from getting a cold  is to practice good hygiene.   Avoid oral or hand contact with people with cold symptoms.   Wash your hands often if contact occurs.  There is no clear evidence that vitamin C, vitamin E, echinacea, or exercise reduces the chance of developing a cold. However, it is always recommended to get plenty of rest, exercise, and practice good nutrition.  SEEK MEDICAL CARE IF:   You are getting worse rather than better.   Your symptoms are not controlled by medicine.   You have chills.  You have  worsening shortness of breath.  You have brown or red mucus.  You have yellow or brown nasal discharge.  You have pain in your face, especially when you bend forward.  You have a fever.  You have swollen neck glands.  You have pain while swallowing.  You have white areas in the back of your throat. SEEK IMMEDIATE MEDICAL CARE IF:   You have severe or persistent:  Headache.  Ear pain.  Sinus pain.  Chest pain.  You have chronic lung disease and any of the following:  Wheezing.  Prolonged cough.  Coughing up blood.  A change in your usual mucus.  You have a stiff neck.  You have changes in your:  Vision.  Hearing.  Thinking.  Mood. MAKE SURE YOU:   Understand these instructions.  Will watch your condition.  Will get help right away if you are not doing well or get worse.   This information is not intended to replace advice given to you by your health care provider. Make sure you discuss any questions you have with your health care provider.   Document Released: 05/16/2001 Document Revised: 04/06/2015 Document Reviewed: 02/25/2014 Elsevier Interactive Patient Education 2016 [place]. Blunt Abdominal Trauma Blunt abdominal trauma is a type of injury that involves damage to the abdominal wall or to abdominal organs, such as the liver or spleen. The damage can involve bruising, tearing, or a rupture. This type of injury does not involve a puncture of the skin. Blunt abdominal trauma can range from mild to severe. In some cases it can lead to a severe abdominal inflammation (peritonitis), severe bleeding, and a dangerous drop in blood pressure. CAUSES This injury is caused by a hard, direct hit to the abdomen. It can happen after:  A motor vehicle accident.  Being kicked or punched in the abdomen.  Falling from a significant height. RISK FACTORS This injury is more likely to happen in people who:  Play contact sports.  Work in a job in which  falls or injuries are more likely, such as in Architect. SYMPTOMS The main symptom of this condition is pain in the abdomen. Other symptoms depend on the type and location of the injury. They can include:  Abdominal pain that spreads to the the back or shoulder.  Bruising.  Swelling.  Pain when pressing on the abdomen.  Blood in the urine.  Weakness.  Confusion.  Loss of consciousness.  Pale, dusky, cool, or sweaty skin.  Vomiting blood.  Bloody stool or bleeding from the rectum.  Trouble breathing. Symptoms of this injury can develop suddenly or slowly.  DIAGNOSIS This injury is diagnosed based on your symptoms and a physical exam. You may also have tests, including:  Blood tests.  Urine tests.  Imaging tests, such as:  A CT scan and ultrasound of your abdomen.  X-rays of your chest and abdomen.  A test in which a tube is used to flush your abdomen  with fluid and check for blood (diagnostic peritoneal lavage). TREATMENT Treatment for this injury depends on its type and severity. Treatment options include:  Observation. If the injury is mild, this may be the only treatment needed.  Support of your blood pressure and breathing.  Getting blood, fluids, or medicine through an IV tube.  Antibiotic medicine.  Insertion of tubes into the stomach or bladder.  A blood transfusion.  A procedure to stop bleeding. This involves putting a long, thin tube (catheter) into one of your blood vessels (angiographic embolization).  Surgery to open up your abdomen and control bleeding or repair damage (laparotomy). This may be done if tests suggest that you have peritonitis or bleeding that cannot be controlled with angiographic embolization. HOME CARE INSTRUCTIONS  Take medicines only as directed by your health care provider.  If you were prescribed an antibiotic medicine, finish all of it even if you start to feel better.  Follow your health care provider's  instructions about diet and activity restrictions.  Keep all follow-up visits as directed by your health care provider. This is important. SEEK MEDICAL CARE IF:  You continue to have abdominal pain.  Your symptoms return.  You develop new symptoms.  You have blood in your urine or your bowel movements. SEEK IMMEDIATE MEDICAL CARE IF:  You vomit blood.  You have heavy bleeding from your rectum.  You have very bad abdominal pain.  You have trouble breathing.  You have chest pain.  You have a fever.  You have dizziness.  You pass out.   This information is not intended to replace advice given to you by your health care provider. Make sure you discuss any questions you have with your health care provider.   Document Released: 12/28/2004 Document Revised: 04/06/2015 Document Reviewed: 11/11/2014 Elsevier Interactive Patient Education [employer].

## 2015-12-31 NOTE — ED Notes (Signed)
Pt seen here on 1/22 and dx with hematoma of abdominal muscle. States pain is increased and temp of 100 yesterday. Bruising noted to lower abdomen. Also has low back pain

## 2015-12-31 NOTE — ED Provider Notes (Signed)
CSN: [id number]     Arrival date & time 12/31/15  1009 History   First MD Initiated Contact with Patient 12/31/15 1107     Chief Complaint  Patient presents with  . Abdominal Pain     Patient is a 50 y.o. male presenting with abdominal pain. The history is provided by the patient. No language interpreter was used.  Abdominal Pain  [name] is a 50 y.o. male who presents to the Emergency Department complaining of abdominal pain.  He was seen in the emergency department 5 days ago for coughing and abdominal pain and had a CT abdomen that demonstrated a rectus sheath hematoma. He has been taking his cough medicine and pain medicine as prescribed but presents today due to ongoing cough and abdominal pain. The cough has been present for the last week and is dry and nonproductive. He has occasional bloody nasal discharge. He reports diffuse left-sided abdominal pain that is greatest on the left upper quadrant but now is radiating down to the left lower quadrant. He has noticed progressive ecchymosis of his abdominal wall that began 2 days ago. He has pain with walking and standing in the abdominal wall. Pain is also worse with coughing. He states his cough is keeping him up at night and the [employer] are not helping his symptoms. He had a temperature of 100 last night. No vomiting, diarrhea. He takes no blood thinners. His only over-the-counter medication is occasional ibuprofen. He is not using abdominal binder.  Past Medical History  Diagnosis Date  . Pneumonia     Hx of 2014  . Numbness and tingling of both legs     Hx: of  . Lumbar herniated disc     Hx of   Past Surgical History  Procedure Laterality Date  . Back surgery    . Orif patella Left 04/26/2013    Procedure: Partial catalectomy with advancement of patella tendon;  Surgeon: Mcarthur Rossetti, MD;  Location: [facility];  Service: Orthopedics;  Laterality: Left;  . Knee arthroscopy Left 08/26/2013    Procedure:  Manipulation under anesthesia left knee w/ steriiod injection and steroid injection left foot;  Surgeon: Mcarthur Rossetti, MD;  Location: [facility];  Service: Orthopedics;  Laterality: Left;   Family History  Problem Relation Age of Onset  . Hypertension Father    Social History  Substance Use Topics  . Smoking status: Current Every Day Smoker    Types: Cigars  . Smokeless tobacco: Never Used  . Alcohol Use: No    Review of Systems  Gastrointestinal: Positive for abdominal pain.  All other systems reviewed and are negative.     Allergies  Penicillins  Home Medications   Prior to Admission medications   Medication Sig Start Date End Date Taking? Authorizing Provider  benzonatate (TESSALON) 100 MG capsule Take 1 capsule (100 mg total) by mouth every 8 (eight) hours. 12/26/15  Yes Gareth Morgan, MD  oxyCODONE-acetaminophen (PERCOCET/ROXICET) 5-325 MG tablet Take 1-2 tablets by mouth every 4 (four) hours as needed for severe pain. 12/26/15  Yes Gareth Morgan, MD  Elastic Bandages & Supports (ABDOMINAL BINDER/ELASTIC 3XL) MISC 1 each by Does not apply route every hour while awake. 12/31/15   Quintella Reichert, MD  guaiFENesin-codeine [facility]) 100-10 MG/5ML syrup Take 5 mLs by mouth 3 (three) times daily as needed for cough. 12/31/15   Quintella Reichert, MD  hydrochlorothiazide (HYDRODIURIL) 25 MG tablet Take 1 tablet (25 mg total) by mouth daily. 06/09/14  Hollace Kinnier Sofia, PA-C   BP 126/62 mmHg  Pulse 60  Temp(Src) 98.8 F (37.1 C) (Oral)  Resp 18  Ht 6\' 3"  (1.905 m)  Wt 297 lb (134.718 kg)  BMI 37.12 kg/m2  SpO2 100% Physical Exam  Constitutional: He is oriented to person, place, and time. He appears well-developed and well-nourished.  HENT:  Head: Normocephalic and atraumatic.  Cardiovascular: Normal rate and regular rhythm.   No murmur heard. Pulmonary/Chest: Effort normal and breath sounds normal. No respiratory distress.  Abdominal: Soft.  Mild to moderate left  upper quadrant tenderness with ecchymosis in various stages of healing of the central and left abdominal wall extending from just below the rib cage to the suprapubic region.  Musculoskeletal: He exhibits no tenderness.  Nonpitting edema bilateral lower extremities.  Neurological: He is alert and oriented to person, place, and time.  Skin: Skin is warm and dry.  Psychiatric: He has a normal mood and affect. His behavior is normal.  Nursing note and vitals reviewed.   ED Course  Procedures (including critical care time) Labs Review Labs Reviewed  COMPREHENSIVE METABOLIC PANEL - Abnormal; Notable for the following:    Glucose, Bld 121 (*)    Calcium 8.6 (*)    All other components within normal limits  CBC WITH DIFFERENTIAL/PLATELET - Abnormal; Notable for the following:    WBC 12.9 (*)    Lymphs Abs 4.8 (*)    All other components within normal limits  LIPASE, BLOOD    Imaging Review Dg Chest 2 View  12/31/2015  CLINICAL DATA:  Persistent nonproductive cough and fever. EXAM: CHEST  2 VIEW COMPARISON:  12/26/2015 FINDINGS: Cardiomediastinal silhouette is normal. Mediastinal contours appear intact. There is no evidence of focal airspace consolidation, pleural effusion or pneumothorax. Low lung volumes with peribronchovascular thickening with central predominance. Osseous structures are without acute abnormality. Soft tissues are grossly normal. IMPRESSION: Low lung volumes. Peribronchovascular thickening with central predominance, which may be seen with acute bronchitis or reactive airway disease. Electronically Signed   By: Fidela Salisbury M.D.   On: 12/31/2015 11:44   I have personally reviewed and evaluated these images and lab results as part of my medical decision-making.   EKG Interpretation None      MDM   Final diagnoses:  Bronchitis  Abdominal wall hematoma, subsequent encounter    Patient here for evaluation of cough and no abdominal pain and bruising noted and  present for the last several days, recently seen in the emergency department a CT abdomen that demonstrates a rectus sheath hematoma. Patient's lung exam is benign with no respiratory distress and no evidence of pneumonia. Abdominal exam is without peritonitis or concerning findings. There is no evidence of acute abscess, cellulitis, kidney features related to his rectus sheath hematoma. Offered patient encouragement and home care with abdominal Binder. Treating cough. Discussed outpatient follow-up as well as close return precautions for cough as well as abdominal pain/hematoma.    Quintella Reichert, MD 12/31/15 [phone]

## 2019-02-08 ENCOUNTER — Emergency Department (HOSPITAL_BASED_OUTPATIENT_CLINIC_OR_DEPARTMENT_OTHER): Payer: Self-pay

## 2019-02-08 ENCOUNTER — Encounter (HOSPITAL_BASED_OUTPATIENT_CLINIC_OR_DEPARTMENT_OTHER): Payer: Self-pay | Admitting: *Deleted

## 2019-02-08 ENCOUNTER — Other Ambulatory Visit: Payer: Self-pay

## 2019-02-08 ENCOUNTER — Emergency Department (HOSPITAL_BASED_OUTPATIENT_CLINIC_OR_DEPARTMENT_OTHER)
Admission: EM | Admit: 2019-02-08 | Discharge: 2019-02-08 | Disposition: A | Discharge status: Home / Self Care | Payer: Self-pay | Attending: Emergency Medicine | Admitting: Emergency Medicine

## 2019-02-08 DIAGNOSIS — Z79899 Other long term (current) drug therapy: Secondary | ICD-10-CM | POA: Insufficient documentation

## 2019-02-08 DIAGNOSIS — N503 Cyst of epididymis: Secondary | ICD-10-CM | POA: Insufficient documentation

## 2019-02-08 DIAGNOSIS — R109 Unspecified abdominal pain: Secondary | ICD-10-CM

## 2019-02-08 DIAGNOSIS — F17290 Nicotine dependence, other tobacco product, uncomplicated: Secondary | ICD-10-CM | POA: Insufficient documentation

## 2019-02-08 DIAGNOSIS — N50812 Left testicular pain: Secondary | ICD-10-CM

## 2019-02-08 LAB — COMPREHENSIVE METABOLIC PANEL
ALBUMIN: 3.7 g/dL (ref 3.5–5.0)
ALT: 21 U/L (ref 0–44)
AST: 19 U/L (ref 15–41)
Alkaline Phosphatase: 106 U/L (ref 38–126)
Anion gap: 5 (ref 5–15)
BILIRUBIN TOTAL: 0.4 mg/dL (ref 0.3–1.2)
BUN: 15 mg/dL (ref 6–20)
CO2: 25 mmol/L (ref 22–32)
Calcium: 8.8 mg/dL — ABNORMAL LOW (ref 8.9–10.3)
Chloride: 105 mmol/L (ref 98–111)
Creatinine, Ser: 1.03 mg/dL (ref 0.61–1.24)
GFR calc Af Amer: >60 mL/min (ref >60)
GFR calc non Af Amer: >60 mL/min (ref >60)
GLUCOSE: 117 mg/dL — AB (ref 70–99)
Potassium: 3.9 mmol/L (ref 3.5–5.1)
Sodium: 135 mmol/L (ref 135–145)
TOTAL PROTEIN: 7.2 g/dL (ref 6.5–8.1)

## 2019-02-08 LAB — URINALYSIS, ROUTINE W REFLEX MICROSCOPIC
Bilirubin Urine: NEGATIVE
Glucose, UA: NEGATIVE mg/dL
Hgb urine dipstick: NEGATIVE
Ketones, ur: NEGATIVE mg/dL
Leukocytes,Ua: NEGATIVE
NITRITE: NEGATIVE
PH: 6 (ref 5.0–8.0)
Protein, ur: NEGATIVE mg/dL
Specific Gravity, Urine: >1.03 — ABNORMAL HIGH (ref 1.005–1.030)

## 2019-02-08 LAB — CBC WITH DIFFERENTIAL/PLATELET
Abs Immature Granulocytes: 0.05 10*3/uL (ref 0.00–0.07)
Basophils Absolute: 0.1 10*3/uL (ref 0.0–0.1)
Basophils Relative: 1 %
EOS PCT: 2 %
Eosinophils Absolute: 0.2 10*3/uL (ref 0.0–0.5)
HCT: 45.3 % (ref 39.0–52.0)
Hemoglobin: 14.9 g/dL (ref 13.0–17.0)
Immature Granulocytes: 1 %
Lymphocytes Relative: 43 %
Lymphs Abs: 4.6 10*3/uL — ABNORMAL HIGH (ref 0.7–4.0)
MCH: 30.6 pg (ref 26.0–34.0)
MCHC: 32.9 g/dL (ref 30.0–36.0)
MCV: 93 fL (ref 80.0–100.0)
Monocytes Absolute: 0.7 10*3/uL (ref 0.1–1.0)
Monocytes Relative: 7 %
NRBC: 0 % (ref 0.0–0.2)
Neutro Abs: 5.2 10*3/uL (ref 1.7–7.7)
Neutrophils Relative %: 46 %
Platelets: 233 10*3/uL (ref 150–400)
RBC: 4.87 MIL/uL (ref 4.22–5.81)
RDW: 12.6 % (ref 11.5–15.5)
WBC: 10.8 10*3/uL — ABNORMAL HIGH (ref 4.0–10.5)

## 2019-02-08 MED ORDER — HYDROCODONE-ACETAMINOPHEN 5-325 MG PO TABS: 1.0000 | ORAL_TABLET | Freq: Four times a day (QID) | ORAL | 0 refills | Status: DC | PRN

## 2019-02-08 MED ORDER — KETOROLAC TROMETHAMINE 30 MG/ML IJ SOLN
30.0000 mg | Freq: Once | INTRAMUSCULAR | Status: AC
  Administered 2019-02-08: 30 mg via INTRAVENOUS
  Filled 2019-02-08: qty 1

## 2019-02-08 MED ORDER — ONDANSETRON HCL 4 MG/2ML IJ SOLN
4.0000 mg | Freq: Once | INTRAMUSCULAR | Status: AC
Start: 2019-02-08 — End: 2019-02-08
  Administered 2019-02-08: 4 mg via INTRAVENOUS
  Filled 2019-02-08: qty 2

## 2019-02-08 MED ORDER — MORPHINE SULFATE (PF) 4 MG/ML IV SOLN
4.0000 mg | Freq: Once | INTRAVENOUS | Status: AC
  Administered 2019-02-08: 4 mg via INTRAVENOUS
  Filled 2019-02-08: qty 1

## 2019-02-08 NOTE — ED Provider Notes (Signed)
[facility] EMERGENCY DEPARTMENT Provider Note   CSN: [id number] Arrival date & time: 02/08/19  1632    History   Chief Complaint Chief Complaint  Patient presents with  . Flank Pain    HPI [name] is a 53 y.o. male who presents for evaluation of 4 days of left flank pain and testicle pain. Patient reports he feels like the pain started in the left testicle and radiates up to his LLQ and flank. Patient states that pain is constant in nature. He states it is currently a 7/10. He has had some nausea but no vomiting. He states he feels like today he has had some decreased urination but has noted any pain or hematuria. Patient reports he took Naproxen for pain as well as some left over antibiotics he had from a dental procedure. Patient states that sometimes the pain will radiate up the abdomen to the chest but otherwise does not endorse chest pain or SOB. Patient denies any fevers, vomiting, testicle redness or swelling.      The history is provided by the patient.    Past Medical History:  Diagnosis Date  . Lumbar herniated disc    Hx of  . Numbness and tingling of both legs    Hx: of  . Pneumonia    Hx of 2014    Patient Active Problem List   Diagnosis Date Noted  . Left Knee ankylosis 08/26/2013  . Right patella fracture 04/26/2013    Past Surgical History:  Procedure Laterality Date  . BACK SURGERY    . KNEE ARTHROSCOPY Left 08/26/2013   Procedure: Manipulation under anesthesia left knee w/ steriiod injection and steroid injection left foot;  Surgeon: Mcarthur Rossetti, MD;  Location: [facility];  Service: Orthopedics;  Laterality: Left;  . ORIF PATELLA Left 04/26/2013   Procedure: Partial catalectomy with advancement of patella tendon;  Surgeon: Mcarthur Rossetti, MD;  Location: [facility];  Service: Orthopedics;  Laterality: Left;        Home Medications    Prior to Admission medications   Medication Sig Start Date End Date Taking? Authorizing  Provider  benzonatate (TESSALON) 100 MG capsule Take 1 capsule (100 mg total) by mouth every 8 (eight) hours. 12/26/15   Gareth Morgan, MD  Elastic Bandages & Supports (ABDOMINAL BINDER/ELASTIC 3XL) MISC 1 each by Does not apply route every hour while awake. 12/31/15   Quintella Reichert, MD  guaiFENesin-codeine [facility]) 100-10 MG/5ML syrup Take 5 mLs by mouth 3 (three) times daily as needed for cough. 12/31/15   Quintella Reichert, MD  hydrochlorothiazide (HYDRODIURIL) 25 MG tablet Take 1 tablet (25 mg total) by mouth daily. 06/09/14   Fransico Meadow, PA-C  HYDROcodone-acetaminophen (NORCO/VICODIN) 5-325 MG tablet Take 1-2 tablets by mouth every 6 (six) hours as needed. 02/08/19   Volanda Napoleon, PA-C  oxyCODONE-acetaminophen (PERCOCET/ROXICET) 5-325 MG tablet Take 1-2 tablets by mouth every 4 (four) hours as needed for severe pain. 12/26/15   Gareth Morgan, MD    Family History Family History  Problem Relation Age of Onset  . Hypertension Father     Social History Social History   Tobacco Use  . Smoking status: Current Every Day Smoker    Types: Cigars  . Smokeless tobacco: Never Used  Substance Use Topics  . Alcohol use: No  . Drug use: No     Allergies   Penicillins   Review of Systems Review of Systems  Constitutional: Negative for fever.  Respiratory:  Negative for cough and shortness of breath.   Cardiovascular: Negative for chest pain.  Gastrointestinal: Positive for nausea. Negative for abdominal pain and vomiting.  Genitourinary: Positive for decreased urine volume, difficulty urinating, flank pain and testicular pain. Negative for dysuria and hematuria.  Neurological: Negative for headaches.  All other systems reviewed and are negative.    Physical Exam Updated Vital Signs BP (!) 145/80   Pulse 62   Temp 98.2 F (36.8 C) (Oral)   Resp 20   Ht 6\' 3"  (1.905 m)   Wt 131.5 kg   SpO2 98%   BMI 36.25 kg/m   Physical Exam Vitals signs and nursing note  reviewed. Exam conducted with a chaperone present.  Constitutional:      Appearance: Normal appearance. He is well-developed.  HENT:     Head: Normocephalic and atraumatic.  Eyes:     General: Lids are normal.     Conjunctiva/sclera: Conjunctivae normal.     Pupils: Pupils are equal, round, and reactive to light.  Neck:     Musculoskeletal: Full passive range of motion without pain.  Cardiovascular:     Rate and Rhythm: Normal rate and regular rhythm.     Pulses: Normal pulses.          Radial pulses are 2+ on the right side and 2+ on the left side.       Dorsalis pedis pulses are 2+ on the right side and 2+ on the left side.     Heart sounds: Normal heart sounds. No murmur. No friction rub. No gallop.   Pulmonary:     Effort: Pulmonary effort is normal.     Breath sounds: Normal breath sounds.  Abdominal:     Palpations: Abdomen is soft. Abdomen is not rigid.     Tenderness: There is abdominal tenderness in the left lower quadrant. There is no right CVA tenderness, left CVA tenderness or guarding.     Hernia: There is no hernia in the right inguinal area or left inguinal area.     Comments: Abdomen is soft, nondistended.  Tenderness noted in the left lower quadrant.  No CVA tenderness bilaterally.  Genitourinary:    Penis: Normal.      Scrotum/Testes:        Right: Mass, tenderness or swelling not present.        Left: Tenderness present. Mass or swelling not present.     Comments: The exam was performed with a chaperone present.  Tenderness palpation of the left testicle.  No palpable mass.  No overlying warmth, erythema, edema.  No tenderness palpation of the right testicle.  No evidence of hernia bilaterally. Musculoskeletal: Normal range of motion.  Skin:    General: Skin is warm and dry.     Capillary Refill: Capillary refill takes less than 2 seconds.  Neurological:     Mental Status: He is alert and oriented to person, place, and time.  Psychiatric:        Speech: Speech  normal.      ED Treatments / Results  Labs (all labs ordered are listed, but only abnormal results are displayed) Labs Reviewed  URINALYSIS, ROUTINE W REFLEX MICROSCOPIC - Abnormal; Notable for the following components:      Result Value   Specific Gravity, Urine >1.030 (*)    All other components within normal limits  COMPREHENSIVE METABOLIC PANEL - Abnormal; Notable for the following components:   Glucose, Bld 117 (*)    Calcium 8.8 (*)  All other components within normal limits  CBC WITH DIFFERENTIAL/PLATELET - Abnormal; Notable for the following components:   WBC 10.8 (*)    Lymphs Abs 4.6 (*)    All other components within normal limits    EKG None  Radiology Ct Renal Trost Study  Result Date: 02/08/2019 CLINICAL DATA:  Left-sided groin and flank pain EXAM: CT ABDOMEN AND PELVIS WITHOUT CONTRAST TECHNIQUE: Multidetector CT imaging of the abdomen and pelvis was performed following the standard protocol without IV contrast. COMPARISON:  12/26/2015 FINDINGS: Lower chest: No acute abnormality. Hepatobiliary: No focal liver abnormality is seen. No gallstones, gallbladder wall thickening, or biliary dilatation. Pancreas: Unremarkable. No pancreatic ductal dilatation or surrounding inflammatory changes. Spleen: Normal in size without focal abnormality. Adrenals/Urinary Tract: Adrenal glands are unremarkable. Kidneys are normal, without renal calculi, focal lesion, or hydronephrosis. Bladder is unremarkable. Stomach/Bowel: Stomach is within normal limits. Appendix appears normal. No evidence of bowel wall thickening, distention, or inflammatory changes. Moderate burden of stool in the colon. Vascular/Lymphatic: Calcific atherosclerosis. No enlarged abdominal or pelvic lymph nodes. Reproductive: No mass or other abnormality. Other: No abdominal wall hernia or abnormality. No abdominopelvic ascites. Musculoskeletal: No acute or significant osseous findings. IMPRESSION: No non-contrast CT  findings of the abdomen or pelvis to explain left-sided groin and flank pain. No evidence of urinary tract calculus or hydronephrosis. Electronically Signed   By: Eddie Candle M.D.   On: 02/08/2019 20:01   US Scrotum W/doppler  Result Date: 02/08/2019 CLINICAL DATA:  Left lower quadrant, left testicle pain EXAM: SCROTAL ULTRASOUND DOPPLER ULTRASOUND OF THE TESTICLES TECHNIQUE: Complete ultrasound examination of the testicles, epididymis, and other scrotal structures was performed. Color and spectral Doppler ultrasound were also utilized to evaluate blood flow to the testicles. COMPARISON:  None. FINDINGS: Right testicle Measurements: 4.6 x 2.4 x 3.2 cm. No mass or microlithiasis visualized. Left testicle Measurements: 4.4 x 2.6 x 3.4 cm. No mass or microlithiasis visualized. Right epididymis:  Multiple epididymal cysts, the largest 1 cm. Left epididymis:  Normal in size and appearance. Hydrocele:  Mild left hydrocele Varicocele:  None visualized. Pulsed Doppler interrogation of both testes demonstrates normal low resistance arterial and venous waveforms bilaterally. IMPRESSION: No testicular abnormality or evidence of torsion. Small left hydrocele. Multiple epididymal cysts on the right.  The largest 1 cm. Electronically Signed   By: Rolm Baptise M.D.   On: 02/08/2019 19:01    Procedures Procedures (including critical care time)  Medications Ordered in ED Medications  ondansetron (ZOFRAN) injection 4 mg (4 mg Intravenous Given 02/08/19 1757)  ketorolac (TORADOL) 30 MG/ML injection 30 mg (30 mg Intravenous Given 02/08/19 1756)  morphine 4 MG/ML injection 4 mg (4 mg Intravenous Given 02/08/19 1920)     Initial Impression / Assessment and Plan / ED Course  I have reviewed the triage vital signs and the nursing notes.  Pertinent labs & imaging results that were available during my care of the patient were reviewed by me and considered in my medical decision making (see chart for details).         53 year old male who presents for evaluation of 4 days of left testicular pain as well as left flank pain.  States he feels like the pain starts in his testicle and radiates up into his stomach.  He has had some decreased urine and difficulty urinating but otherwise denies any dysuria, hematuria.  No fevers, vomiting.  He does not have a history of kidney stones. Patient is afebrile, non-toxic appearing, sitting comfortably  on examination table. Vital signs reviewed and stable.  GU exam shows tenderness palpation in the left testicle without any overlying warmth, erythema, edema.  No evidence of hernia.  No palpable mass.  Consider epididymitis versus kidney Mcm[place].  We will plan to check labs, ultrasound.  History/physical exam not concerning for diverticulitis, perforation, obstruction, aortic dissection.  CBC shows slight leukocytosis of 10.8.  Otherwise unremarkable.  CMP is unremarkable.  UA without any infectious etiology.  Ultrasound shows small left hydrocele.  There is no evidence of testicular torsion.  There is multiple cysts noted on the right testicle.  Discussed results with patient, including right epididymal cyst.  Patient still having pain.  Will get CT renal study for evaluation of possible kidney Ear[place].  CT shows no evidence of kidney [place].  No other acute abnormality.  Discussed results with patient.  Patient reports some improvement in pain after analgesics.  He still has some slight pain.  At this time, no further work-up indicated here in the ED.  We will give him outpatient referral to urology. Discussed patient with Dr. Rogene Houston who agrees with plan. At this time, patient exhibits no emergent life-threatening condition that require further evaluation in ED or admission. Patient had ample opportunity for questions and discussion. All patient's questions were answered with full understanding. Strict return precautions discussed. Patient expresses understanding and agreement to  plan.   Portions of this note were generated with [employer]obby[employer]. Dictation errors may occur despite best attempts at proofreading.    Final Clinical Impressions(s) / ED Diagnoses   Final diagnoses:  Left flank pain  Left testicular pain  Epididymal cyst    ED Discharge Orders         Ordered    HYDROcodone-acetaminophen (NORCO/VICODIN) 5-325 MG tablet  Every 6 hours PRN     02/08/19 2033           Volanda Napoleon, PA-C 02/08/19 2049    Fredia Sorrow, MD 02/09/19 1529

## 2019-02-08 NOTE — ED Triage Notes (Signed)
Dull left flank pain x 4 days, worse today. Pain in left testicle today. +nausea

## 2019-02-08 NOTE — ED Notes (Signed)
Pt and family understood dc material. NAD noted. Script sent in electronically. All questions answered to satisfaction. Pt and family escorted to check out counter

## 2019-02-08 NOTE — Discharge Instructions (Signed)
You can take Tylenol or Ibuprofen as directed for pain. You can alternate Tylenol and Ibuprofen every 4 hours. If you take Tylenol at 1pm, then you can take Ibuprofen at 5pm. Then you can take Tylenol again at 9pm.   Take pain medications as directed for break through pain. Do not drive or operate machinery while taking this medication.   As we discussed, follow-up with urology.  As we discussed, your ultrasound did show evidence of an epididymal cyst on the right.  You can follow-up with Urology for this.  Return emergency department for any fever, worsening pain, vomiting or any other worsening or concerning symptoms.

## 2019-02-08 NOTE — ED Notes (Signed)
Patient transported to CT 

## 2019-02-08 NOTE — ED Notes (Signed)
Patient transported to Ultrasound 

## 2019-02-08 NOTE — ED Notes (Signed)
ED Provider at bedside. 

## 2019-05-31 IMAGING — CT CT RENAL STONE PROTOCOL
2 of 4 series · 16 of 46 positions shown, 18 images · non-contrast
Comparison: 12/26/2015

CLINICAL DATA: Left-sided groin and flank pain

EXAM:
CT ABDOMEN AND PELVIS WITHOUT CONTRAST
TECHNIQUE: Multidetector CT imaging of the abdomen and pelvis was performed
following the standard protocol without IV contrast.

[Series 2: axial st · axial · 0.98mm/px · z∈[-526,-56]mm · 13 of 104 slices shown, 15 images]
[im 5/104  soft-tissue]
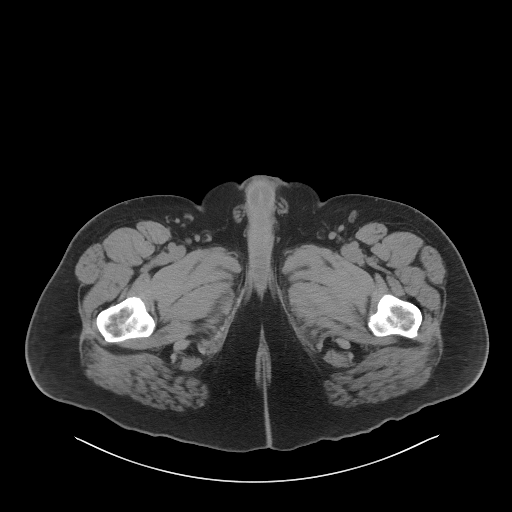
[im 5/104  bone]
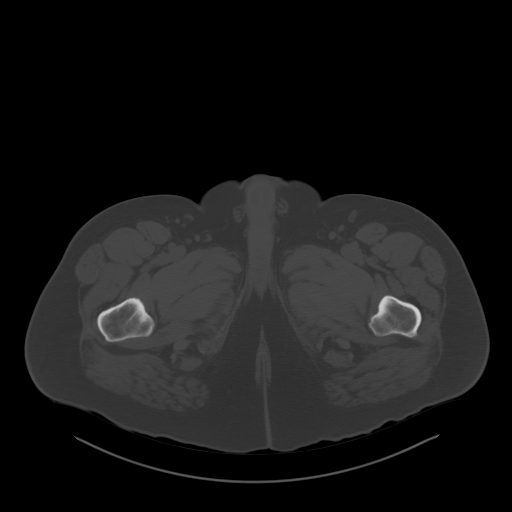
[im 13/104  soft-tissue]
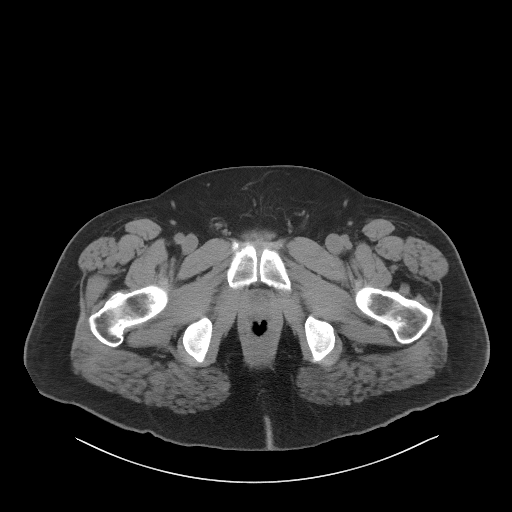
[im 21/104  soft-tissue]
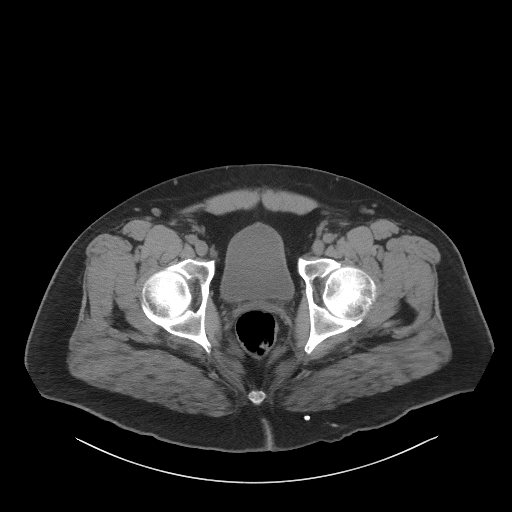
[im 29/104  soft-tissue]
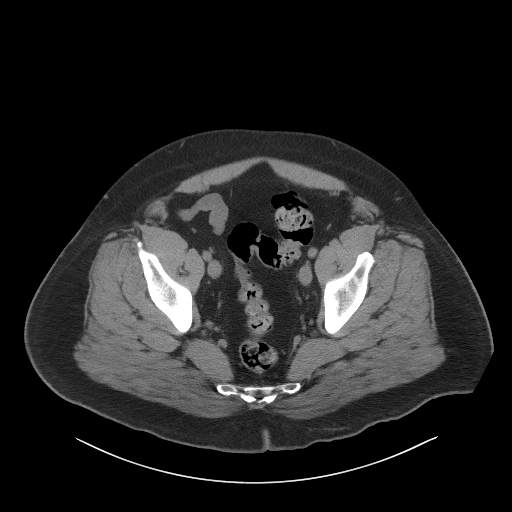
[im 38/104  soft-tissue]
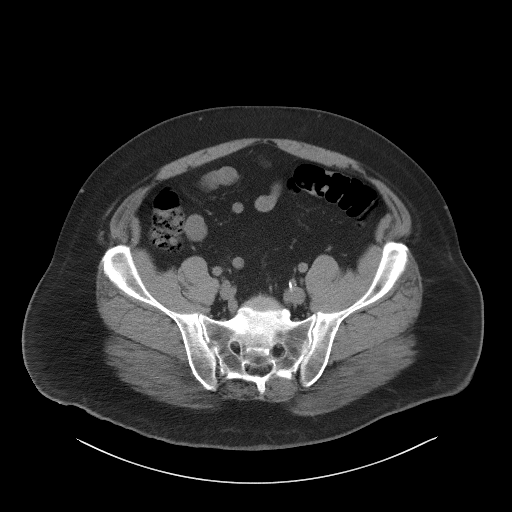
[im 46/104  soft-tissue]
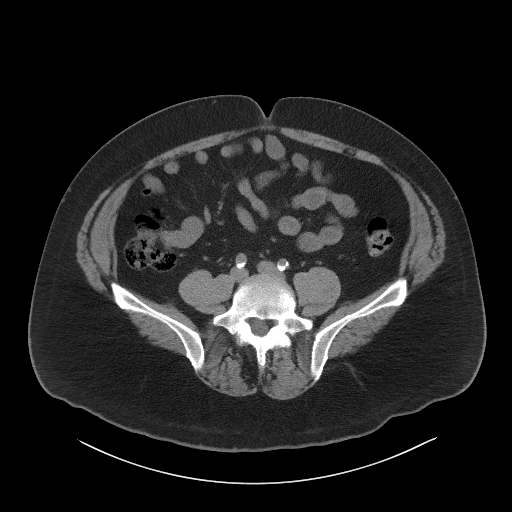
[im 54/104  soft-tissue]
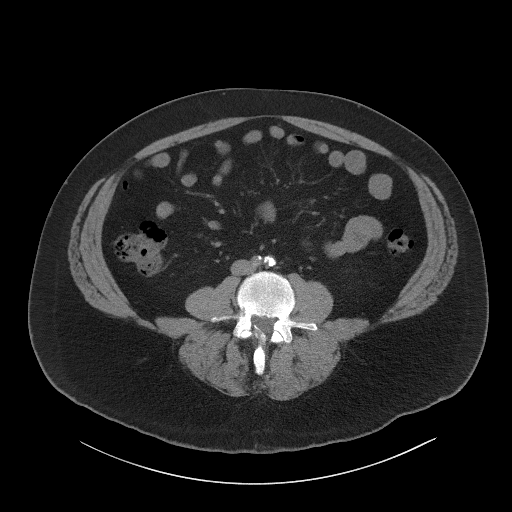
[im 58/104  soft-tissue]
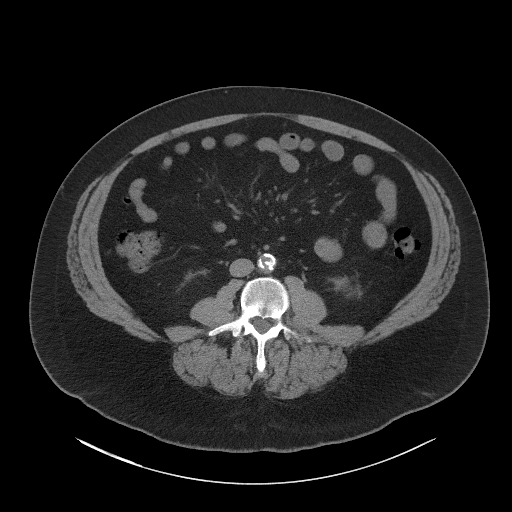
[im 66/104  soft-tissue]
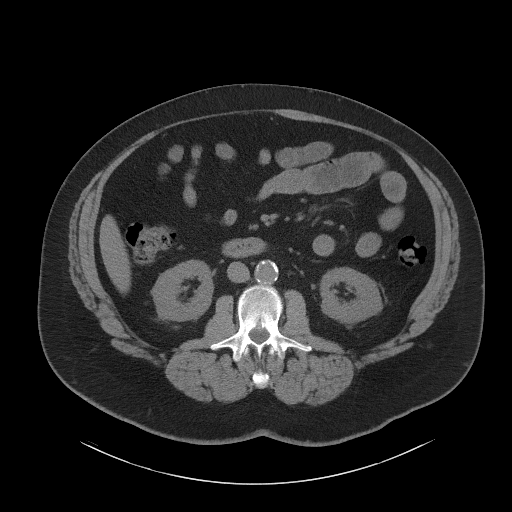
[im 66/104  bone]
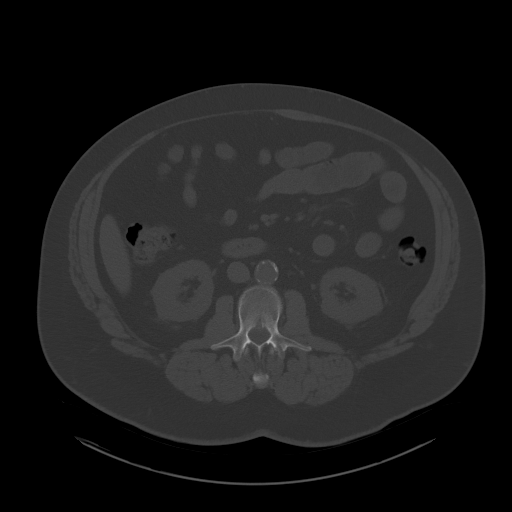
[im 75/104  soft-tissue]
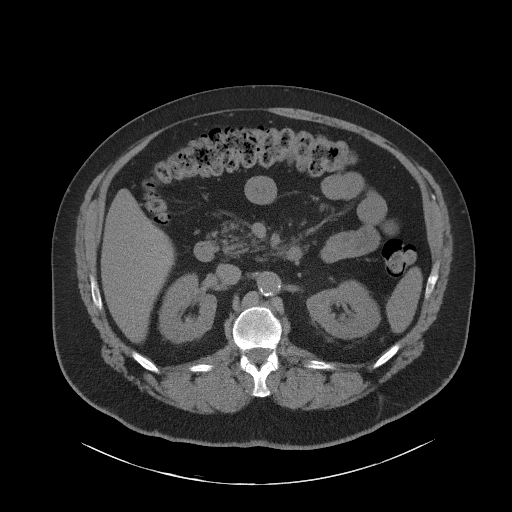
[im 83/104  soft-tissue]
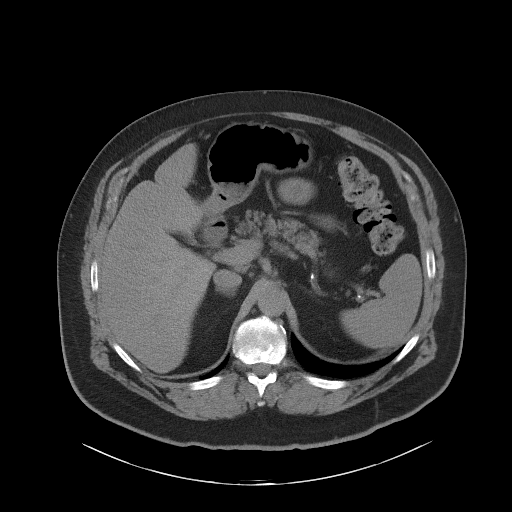
[im 91/104  soft-tissue]
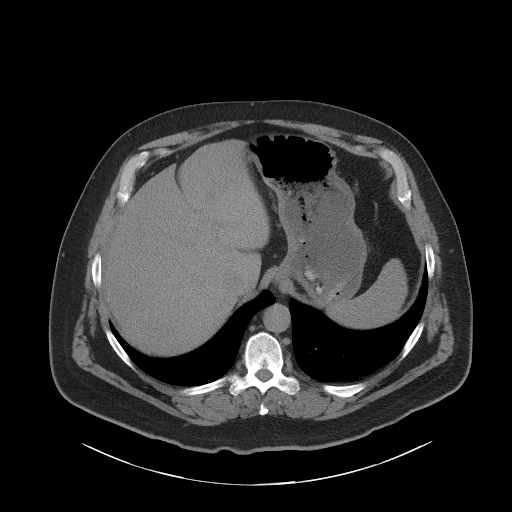
[im 99/104  soft-tissue]
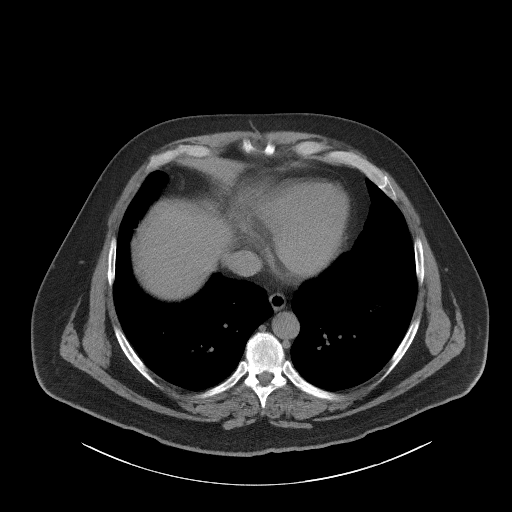

[Series 5: coronal st · coronal · 0.90mm/px · 3 of 118 slices shown]
[im 40/118  soft-tissue]
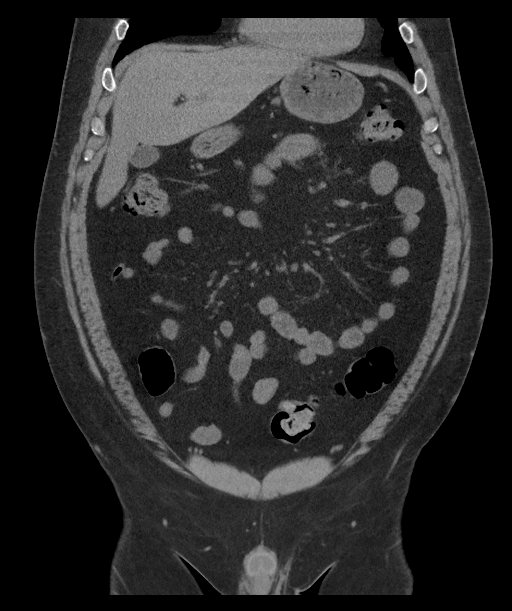
[im 53/118  soft-tissue]
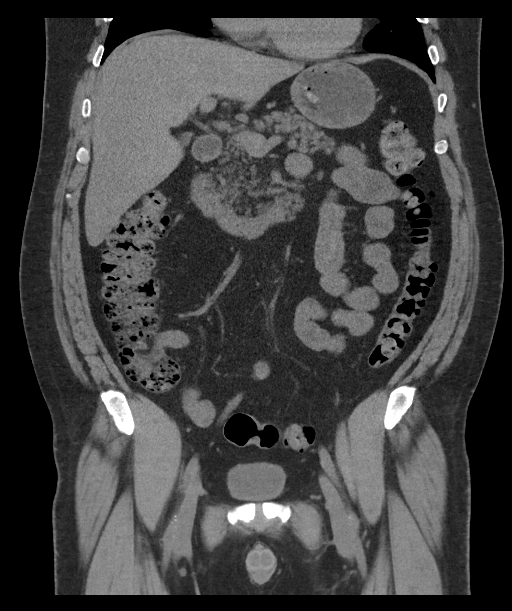
[im 66/118  soft-tissue]
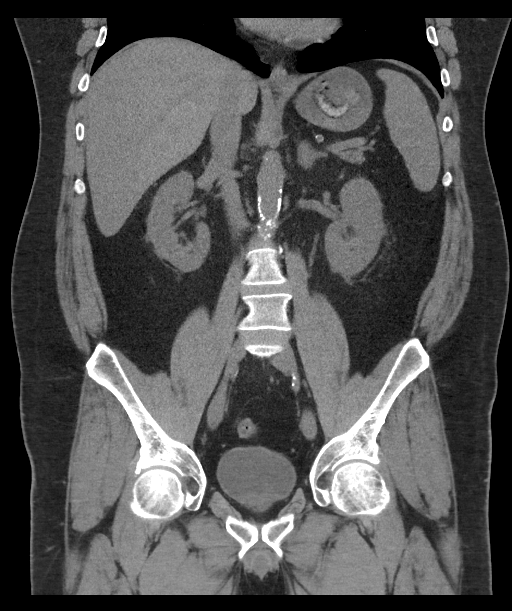

[16 of 46 positions shown; findings below may reference images not displayed]

FINDINGS: Lower chest: No acute abnormality.

Hepatobiliary: No focal liver abnormality is seen. No gallstones,
gallbladder wall thickening, or biliary dilatation.

Pancreas: Unremarkable. No pancreatic ductal dilatation or
surrounding inflammatory changes.

Spleen: Normal in size without focal abnormality.

Adrenals/Urinary Tract: Adrenal glands are unremarkable. Kidneys are
normal, without renal calculi, focal lesion, or hydronephrosis.
Bladder is unremarkable.

Stomach/Bowel: Stomach is within normal limits. Appendix appears
normal. No evidence of bowel wall thickening, distention, or
inflammatory changes. Moderate burden of stool in the colon.

Vascular/Lymphatic: Calcific atherosclerosis. No enlarged abdominal
or pelvic lymph nodes.

Reproductive: No mass or other abnormality.

Other: No abdominal wall hernia or abnormality. No abdominopelvic
ascites.

Musculoskeletal: No acute or significant osseous findings.
IMPRESSION: No non-contrast CT findings of the abdomen or pelvis to explain
left-sided groin and flank pain. No evidence of urinary tract
calculus or hydronephrosis.

## 2020-03-05 ENCOUNTER — Ambulatory Visit: Payer: Self-pay | Attending: Internal Medicine

## 2020-03-05 DIAGNOSIS — Z23 Encounter for immunization: Secondary | ICD-10-CM

## 2020-03-05 NOTE — Progress Notes (Signed)
   Covid-19 Vaccination Clinic  Name:  [name]    MRN: [id number] DOB: May 15, 1966  03/05/2020  [name] was observed post Covid-19 immunization for 15 minutes without incident. He was provided with Vaccine Information Sheet and instruction to access the V-Safe system.   [name] was instructed to call 911 with any severe reactions post vaccine: Marland Kitchen Difficulty breathing  . Swelling of face and throat  . A fast heartbeat  . A bad rash all over body  . Dizziness and weakness   Immunizations Administered    Name Date Dose VIS Date Route   Pfizer COVID-19 Vaccine 03/05/2020  8:21 AM 0.3 mL 11/14/2019 Intramuscular   Manufacturer: [place]   Lot: [id number]   [place]: [id number]

## 2020-03-30 ENCOUNTER — Ambulatory Visit: Payer: Self-pay | Attending: Internal Medicine

## 2020-03-30 DIAGNOSIS — Z23 Encounter for immunization: Secondary | ICD-10-CM

## 2020-03-30 NOTE — Progress Notes (Signed)
   Covid-19 Vaccination Clinic  Name:  [name]    MRN: [id number] DOB: 07-May-1966  03/30/2020  [name] was observed post Covid-19 immunization for 15 minutes without incident. He was provided with Vaccine Information Sheet and instruction to access the V-Safe system.   [name] was instructed to call 911 with any severe reactions post vaccine: Marland Kitchen Difficulty breathing  . Swelling of face and throat  . A fast heartbeat  . A bad rash all over body  . Dizziness and weakness   Immunizations Administered    Name Date Dose VIS Date Route   Pfizer COVID-19 Vaccine 03/30/2020  8:33 AM 0.3 mL 01/28/2019 Intramuscular   Manufacturer: [place]   Lot: [id number]   [place]: [id number]

## 2020-10-14 ENCOUNTER — Other Ambulatory Visit: Payer: Self-pay

## 2020-10-15 ENCOUNTER — Ambulatory Visit: Payer: Self-pay | Admitting: Nurse Practitioner

## 2020-10-18 ENCOUNTER — Other Ambulatory Visit: Payer: Self-pay

## 2020-10-19 ENCOUNTER — Ambulatory Visit (INDEPENDENT_AMBULATORY_CARE_PROVIDER_SITE_OTHER): Payer: PRIVATE HEALTH INSURANCE | Admitting: Nurse Practitioner

## 2020-10-19 ENCOUNTER — Encounter: Payer: Self-pay | Admitting: Nurse Practitioner

## 2020-10-19 VITALS — BP 130/78 | HR 86 | Temp 97.1°F | Ht 74.0 in | Wt 299.4 lb

## 2020-10-19 DIAGNOSIS — Z125 Encounter for screening for malignant neoplasm of prostate: Secondary | ICD-10-CM

## 2020-10-19 DIAGNOSIS — Z23 Encounter for immunization: Secondary | ICD-10-CM

## 2020-10-19 DIAGNOSIS — Z13220 Encounter for screening for lipoid disorders: Secondary | ICD-10-CM | POA: Diagnosis not present

## 2020-10-19 DIAGNOSIS — E559 Vitamin D deficiency, unspecified: Secondary | ICD-10-CM

## 2020-10-19 DIAGNOSIS — R195 Other fecal abnormalities: Secondary | ICD-10-CM | POA: Diagnosis not present

## 2020-10-19 DIAGNOSIS — R739 Hyperglycemia, unspecified: Secondary | ICD-10-CM

## 2020-10-19 DIAGNOSIS — Z1211 Encounter for screening for malignant neoplasm of colon: Secondary | ICD-10-CM

## 2020-10-19 DIAGNOSIS — Z136 Encounter for screening for cardiovascular disorders: Secondary | ICD-10-CM

## 2020-10-19 DIAGNOSIS — Z0001 Encounter for general adult medical examination with abnormal findings: Secondary | ICD-10-CM

## 2020-10-19 DIAGNOSIS — Z720 Tobacco use: Secondary | ICD-10-CM | POA: Insufficient documentation

## 2020-10-19 DIAGNOSIS — Z6838 Body mass index (BMI) 38.0-38.9, adult: Secondary | ICD-10-CM | POA: Diagnosis not present

## 2020-10-19 DIAGNOSIS — Z122 Encounter for screening for malignant neoplasm of respiratory organs: Secondary | ICD-10-CM

## 2020-10-19 LAB — LIPID PANEL
Cholesterol: 166 mg/dL (ref 0–200)
HDL: 27.1 mg/dL — ABNORMAL LOW (ref >39.00)
LDL Cholesterol: 113 mg/dL — ABNORMAL HIGH (ref 0–99)
NonHDL: 139.25
Total CHOL/HDL Ratio: 6
Triglycerides: 133 mg/dL (ref 0.0–149.0)
VLDL: 26.6 mg/dL (ref 0.0–40.0)

## 2020-10-19 LAB — CBC WITH DIFFERENTIAL/PLATELET
Basophils Absolute: 0 10*3/uL (ref 0.0–0.1)
Basophils Relative: 0.3 % (ref 0.0–3.0)
Eosinophils Absolute: 0.1 10*3/uL (ref 0.0–0.7)
Eosinophils Relative: 0.6 % (ref 0.0–5.0)
HCT: 45.4 % (ref 39.0–52.0)
Hemoglobin: 15.8 g/dL (ref 13.0–17.0)
Lymphocytes Relative: 34.8 % (ref 12.0–46.0)
Lymphs Abs: 3.8 10*3/uL (ref 0.7–4.0)
MCHC: 34.9 g/dL (ref 30.0–36.0)
MCV: 90.7 fl (ref 78.0–100.0)
Monocytes Absolute: 0.7 10*3/uL (ref 0.1–1.0)
Monocytes Relative: 6.2 % (ref 3.0–12.0)
Neutro Abs: 6.3 10*3/uL (ref 1.4–7.7)
Neutrophils Relative %: 58.1 % (ref 43.0–77.0)
Platelets: 259 10*3/uL (ref 150.0–400.0)
RBC: 5 Mil/uL (ref 4.22–5.81)
RDW: 13.1 % (ref 11.5–15.5)
WBC: 10.9 10*3/uL — ABNORMAL HIGH (ref 4.0–10.5)

## 2020-10-19 LAB — COMPREHENSIVE METABOLIC PANEL
ALT: 20 U/L (ref 0–53)
AST: 18 U/L (ref 0–37)
Albumin: 4 g/dL (ref 3.5–5.2)
Alkaline Phosphatase: 103 U/L (ref 39–117)
BUN: 12 mg/dL (ref 6–23)
CO2: 25 mEq/L (ref 19–32)
Calcium: 9.2 mg/dL (ref 8.4–10.5)
Chloride: 101 mEq/L (ref 96–112)
Creatinine, Ser: 0.96 mg/dL (ref 0.40–1.50)
GFR: 89.1 mL/min (ref >60.00)
Glucose, Bld: 91 mg/dL (ref 70–99)
Potassium: 4.3 mEq/L (ref 3.5–5.1)
Sodium: 134 mEq/L — ABNORMAL LOW (ref 135–145)
Total Bilirubin: 0.7 mg/dL (ref 0.2–1.2)
Total Protein: 7.2 g/dL (ref 6.0–8.3)

## 2020-10-19 LAB — TSH: TSH: 2.57 u[IU]/mL (ref 0.35–4.50)

## 2020-10-19 LAB — PSA: PSA: 0.44 ng/mL (ref 0.10–4.00)

## 2020-10-19 LAB — HEMOGLOBIN A1C: Hgb A1c MFr Bld: 6 % (ref 4.6–6.5)

## 2020-10-19 LAB — IFOBT (OCCULT BLOOD): IFOBT: POSITIVE

## 2020-10-19 NOTE — Patient Instructions (Signed)
Go to lab for blood draw You will be contacted to schedule an appt with GI and pulmonology.  Thank you for choosing [facility] for your health needs  Calorie Counting for Weight Loss Calories are units of energy. Your body needs a certain amount of calories from food to keep you going throughout the day. When you eat more calories than your body needs, your body stores the extra calories as fat. When you eat fewer calories than your body needs, your body burns fat to get the energy it needs. Calorie counting means keeping track of how many calories you eat and drink each day. Calorie counting can be helpful if you need to lose weight. If you make sure to eat fewer calories than your body needs, you should lose weight. Ask your health care provider what a healthy weight is for you. For calorie counting to work, you will need to eat the right number of calories in a day in order to lose a healthy amount of weight per week. A dietitian can help you determine how many calories you need in a day and will give you suggestions on how to reach your calorie goal.  A healthy amount of weight to lose per week is usually 1-2 lb (0.5-0.9 kg). This usually means that your daily calorie intake should be reduced by 500-750 calories.  Eating 1,200 - 1,500 calories per day can help most women lose weight.  Eating 1,500 - 1,800 calories per day can help most men lose weight. What is my plan? My goal is to have __________ calories per day. If I have this many calories per day, I should lose around __________ pounds per week. What do I need to know about calorie counting? In order to meet your daily calorie goal, you will need to:  Find out how many calories are in each food you would like to eat. Try to do this before you eat.  Decide how much of the food you plan to eat.  Write down what you ate and how many calories it had. Doing this is called keeping a food log. To successfully lose weight, it is  important to balance calorie counting with a healthy lifestyle that includes regular activity. Aim for 150 minutes of moderate exercise (such as walking) or 75 minutes of vigorous exercise (such as running) each week. Where do I find calorie information?  The number of calories in a food can be found on a Nutrition Facts label. If a food does not have a Nutrition Facts label, try to look up the calories online or ask your dietitian for help. Remember that calories are listed per serving. If you choose to have more than one serving of a food, you will have to multiply the calories per serving by the amount of servings you plan to eat. For example, the label on a package of bread might say that a serving size is 1 slice and that there are 90 calories in a serving. If you eat 1 slice, you will have eaten 90 calories. If you eat 2 slices, you will have eaten 180 calories. How do I keep a food log? Immediately after each meal, record the following information in your food log:  What you ate. Don't forget to include toppings, sauces, and other extras on the food.  How much you ate. This can be measured in cups, ounces, or number of items.  How many calories each food and drink had.  The total number  of calories in the meal. Keep your food log near you, such as in a small notebook in your pocket, or use a mobile app or website. Some programs will calculate calories for you and show you how many calories you have left for the day to meet your goal. What are some calorie counting tips?   Use your calories on foods and drinks that will fill you up and not leave you hungry: ? Some examples of foods that fill you up are nuts and nut butters, vegetables, lean proteins, and high-fiber foods like whole grains. High-fiber foods are foods with more than 5 g fiber per serving. ? Drinks such as sodas, specialty coffee drinks, alcohol, and juices have a lot of calories, yet do not fill you up.  Eat nutritious  foods and avoid empty calories. Empty calories are calories you get from foods or beverages that do not have many vitamins or protein, such as candy, sweets, and soda. It is better to have a nutritious high-calorie food (such as an avocado) than a food with few nutrients (such as a bag of chips).  Know how many calories are in the foods you eat most often. This will help you calculate calorie counts faster.  Pay attention to calories in drinks. Low-calorie drinks include water and unsweetened drinks.  Pay attention to nutrition labels for "low fat" or "fat free" foods. These foods sometimes have the same amount of calories or more calories than the full fat versions. They also often have added sugar, starch, or salt, to make up for flavor that was removed with the fat.  Find a way of tracking calories that works for you. Get creative. Try different apps or programs if writing down calories does not work for you. What are some portion control tips?  Know how many calories are in a serving. This will help you know how many servings of a certain food you can have.  Use a measuring cup to measure serving sizes. You could also try weighing out portions on a kitchen scale. With time, you will be able to estimate serving sizes for some foods.  Take some time to put servings of different foods on your favorite plates, bowls, and cups so you know what a serving looks like.  Try not to eat straight from a bag or box. Doing this can lead to overeating. Put the amount you would like to eat in a cup or on a plate to make sure you are eating the right portion.  Use smaller plates, glasses, and bowls to prevent overeating.  Try not to multitask (for example, watch TV or use your computer) while eating. If it is time to eat, sit down at a table and enjoy your food. This will help you to know when you are full. It will also help you to be aware of what you are eating and how much you are eating. What are tips  for following this plan? Reading food labels  Check the calorie count compared to the serving size. The serving size may be smaller than what you are used to eating.  Check the source of the calories. Make sure the food you are eating is high in vitamins and protein and low in saturated and trans fats. Shopping  Read nutrition labels while you shop. This will help you make healthy decisions before you decide to purchase your food.  Make a grocery list and stick to it. Cooking  Try to [employer] your favorite foods  in a healthier way. For example, try baking instead of frying.  Use low-fat dairy products. Meal planning  Use more fruits and vegetables. Half of your plate should be fruits and vegetables.  Include lean proteins like poultry and fish. How do I count calories when eating out?  Ask for smaller portion sizes.  Consider sharing an entree and sides instead of getting your own entree.  If you get your own entree, eat only half. Ask for a box at the beginning of your meal and put the rest of your entree in it so you are not tempted to eat it.  If calories are listed on the menu, choose the lower calorie options.  Choose dishes that include vegetables, fruits, whole grains, low-fat dairy products, and lean protein.  Choose items that are boiled, broiled, grilled, or steamed. Stay away from items that are buttered, battered, fried, or served with cream sauce. Items labeled "crispy" are usually fried, unless stated otherwise.  Choose water, low-fat milk, unsweetened iced tea, or other drinks without added sugar. If you want an alcoholic beverage, choose a lower calorie option such as a glass of wine or light beer.  Ask for dressings, sauces, and syrups on the side. These are usually high in calories, so you should limit the amount you eat.  If you want a salad, choose a garden salad and ask for grilled meats. Avoid extra toppings like bacon, cheese, or fried items. Ask for the  dressing on the side, or ask for olive oil and vinegar or lemon to use as dressing.  Estimate how many servings of a food you are given. For example, a serving of cooked rice is  cup or about the size of half a baseball. Knowing serving sizes will help you be aware of how much food you are eating at restaurants. The list below tells you how big or small some common portion sizes are based on everyday objects: ? 1 oz--4 stacked dice. ? 3 oz--1 deck of cards. ? 1 tsp--1 die. ? 1 Tbsp-- a ping-pong ball. ? 2 Tbsp--1 ping-pong ball. ?  cup-- baseball. ? 1 cup--1 baseball. Summary  Calorie counting means keeping track of how many calories you eat and drink each day. If you eat fewer calories than your body needs, you should lose weight.  A healthy amount of weight to lose per week is usually 1-2 lb (0.5-0.9 kg). This usually means reducing your daily calorie intake by 500-750 calories.  The number of calories in a food can be found on a Nutrition Facts label. If a food does not have a Nutrition Facts label, try to look up the calories online or ask your dietitian for help.  Use your calories on foods and drinks that will fill you up, and not on foods and drinks that will leave you hungry.  Use smaller plates, glasses, and bowls to prevent overeating. This information is not intended to replace advice given to you by your health care provider. Make sure you discuss any questions you have with your health care provider. Document Revised: 08/09/2018 Document Reviewed: 10/20/2016 Elsevier Patient Education  [place].

## 2020-10-19 NOTE — Progress Notes (Signed)
Subjective:    Patient ID: [name], male    DOB: [birth year], 54 y.o.   MRN: [id number]  Patient presents today for CPE   HPI  Sexual History (orientation,birth control, marital status, STD):single, not sexually active  Depression/Suicide: Depression screen [facility] 2/9 10/19/2020  Decreased Interest 0  Down, Depressed, Hopeless 0  PHQ - 2 Score 0  Altered sleeping 0  Tired, decreased energy 1  Change in appetite 0  Feeling bad or failure about yourself  0  Trouble concentrating 0  Moving slowly or fidgety/restless 0  Suicidal thoughts 0  PHQ-9 Score 1  Difficult doing work/chores Not difficult at all   Vision:will schedule  Dental:will schedule, has upper dentures  Immunizations: (TDAP, Hep C screen, Pneumovax, Influenza, zoster)  Health Maintenance  Topic Date Due    Hepatitis C: One time screening is recommended by Center for Disease Control  (CDC) for  adults born from [birth year] through 1966.   Never done   HIV Screening  Never done   Colon Cancer Screening  Never done   Flu Shot  07/04/2020   Tetanus Vaccine  09/04/2024   COVID-19 Vaccine  Completed   Diet:regular.  Weight:  Wt Readings from Last 3 Encounters:  10/19/20 299 lb 6.4 oz (135.8 kg)  02/08/19 290 lb (131.5 kg)  12/31/15 297 lb (134.7 kg)   Fall Risk: Fall Risk  10/19/2020  Falls in the past year? 0  Number falls in past yr: 0  Injury with Fall? 0   Medications and allergies reviewed with patient and updated if appropriate.  Patient Active Problem List   Diagnosis Date Noted   Hyperglycemia 10/19/2020   Tobacco use 10/19/2020   Morbid obesity ([place]) 10/19/2020   Left Knee ankylosis 08/26/2013   Right patella fracture 04/26/2013    Current Outpatient Medications on File Prior to Visit  Medication Sig Dispense Refill   hydrochlorothiazide (HYDRODIURIL) 25 MG tablet Take 1 tablet (25 mg total) by mouth daily. (Patient not taking: Reported on 10/19/2020) 30 tablet 0   No current  facility-administered medications on file prior to visit.    Past Medical History:  Diagnosis Date   Lumbar herniated disc    Hx of   Numbness and tingling of both legs    Hx: of   Pneumonia    Hx of 2014    Past Surgical History:  Procedure Laterality Date   BACK SURGERY     KNEE ARTHROSCOPY Left 08/26/2013   Procedure: Manipulation under anesthesia left knee w/ steriiod injection and steroid injection left foot;  Surgeon: Mcarthur Rossetti, MD;  Location: [facility];  Service: Orthopedics;  Laterality: Left;   ORIF PATELLA Left 04/26/2013   Procedure: Partial catalectomy with advancement of patella tendon;  Surgeon: Mcarthur Rossetti, MD;  Location: [facility];  Service: Orthopedics;  Laterality: Left;    Social History   Socioeconomic History   Marital status: Single    Spouse name: Not on file   Number of children: 0   Years of education: Not on file   Highest education level: Not on file  Occupational History   Not on file  Tobacco Use   Smoking status: Current Every Day Smoker    Packs/day: 1.00    Years: 30.00    Pack years: 30.00    Types: Cigars   Smokeless tobacco: Never Used  Substance and Sexual Activity   Alcohol use: No   Drug use: No   Sexual activity: Not Currently  Other Topics Concern   Not on file  Social History Narrative   Not on file   Social Determinants of Health   Financial Resource Strain:    Difficulty of Paying Living Expenses: Not on file  Food Insecurity:    Worried About [place] in the Last Year: Not on file   Ran Out of Food in the Last Year: Not on file  Transportation Needs:    Lack of Transportation (Medical): Not on file   Lack of Transportation (Non-Medical): Not on file  Physical Activity:    Days of Exercise per Week: Not on file   Minutes of Exercise per Session: Not on file  Stress:    Feeling of Stress : Not on file  Social Connections:    Frequency of Communication with  Friends and Family: Not on file   Frequency of Social Gatherings with Friends and Family: Not on file   Attends Religious Services: Not on file   Active Member of Clubs or Organizations: Not on file   Attends Archivist Meetings: Not on file   Marital Status: Not on file    Family History  Problem Relation Age of Onset   Hypertension Mother    Heart disease Mother 64   Stroke Mother    Hypertension Father    Heart disease Father 17   Alcohol abuse Half-Brother        Review of Systems  Constitutional: Negative for fever, malaise/fatigue and weight loss.  HENT: Negative for congestion and sore throat.   Eyes:       Negative for visual changes  Respiratory: Negative for cough, sputum production and shortness of breath.   Cardiovascular: Negative for chest pain, palpitations and leg swelling.  Gastrointestinal: Negative for blood in stool, constipation, diarrhea and heartburn.  Genitourinary: Negative for dysuria, frequency and urgency.  Musculoskeletal: Negative for falls, joint pain and myalgias.  Skin: Negative for rash.  Neurological: Negative for dizziness, tingling, sensory change, focal weakness and headaches.  Endo/Heme/Allergies: Does not bruise/bleed easily.  Psychiatric/Behavioral: Negative for depression, substance abuse and suicidal ideas. The patient is not nervous/anxious.    Objective:   Vitals:   10/19/20 1106  BP: 130/78  Pulse: 86  Temp: (!) 97.1 F (36.2 C)  SpO2: 97%   Body mass index is 38.44 kg/m.  Physical Examination:  Physical Exam Vitals and nursing note reviewed. Exam conducted with a chaperone present.  Constitutional:      General: He is not in acute distress.    Appearance: He is well-developed. He is obese.  HENT:     Right Ear: Tympanic membrane, ear canal and external ear normal.     Left Ear: Tympanic membrane, ear canal and external ear normal.  Eyes:     Extraocular Movements: Extraocular movements  intact.     Conjunctiva/sclera: Conjunctivae normal.  Cardiovascular:     Rate and Rhythm: Normal rate and regular rhythm.     Pulses: Normal pulses.     Heart sounds: Normal heart sounds.  Pulmonary:     Effort: Pulmonary effort is normal. No respiratory distress.     Breath sounds: Normal breath sounds.  Chest:     Chest wall: No tenderness.  Abdominal:     General: Bowel sounds are normal.     Palpations: Abdomen is soft.     Tenderness: There is no abdominal tenderness.  Genitourinary:    Prostate: Normal.     Rectum: Guaiac result positive. Anal fissure  present. No tenderness or external hemorrhoid. Normal anal tone.  Musculoskeletal:        General: Normal range of motion.     Cervical back: Normal range of motion and neck supple.     Right lower leg: No edema.     Left lower leg: No edema.  Lymphadenopathy:     Cervical: No cervical adenopathy.  Skin:    General: Skin is warm and dry.     Findings: Lesion present. No erythema.       Neurological:     Mental Status: He is alert and oriented to person, place, and time.     Deep Tendon Reflexes: Reflexes are normal and symmetric.  Psychiatric:        Mood and Affect: Mood normal.        Behavior: Behavior normal.        Thought Content: Thought content normal.    ASSESSMENT and PLAN: This visit occurred during the SARS-CoV-2 public health emergency.  Safety protocols were in place, including screening questions prior to the visit, additional usage of staff PPE, and extensive cleaning of exam room while observing appropriate contact time as indicated for disinfecting solutions.   Danta was seen today for annual exam.  Diagnoses and all orders for this visit:  Encounter for preventative adult health care exam with abnormal findings -     PSA -     Ambulatory referral to Gastroenterology -     CBC with Differential/Platelet -     Comprehensive metabolic panel  Influenza vaccine needed -     Flu Vaccine QUAD 6+  mos PF IM (Fluarix Quad PF)  Hyperglycemia -     Hemoglobin A1c  Encounter for lipid screening for cardiovascular disease  Prostate cancer screening -     PSA  Positive fecal occult blood test -     IFOBT POC (occult bld, rslt in office) -     Fecal occult blood, imunochemical; Standing  Colon cancer screening  Class 2 severe obesity due to excess calories with serious comorbidity and body mass index (BMI) of 38.0 to 38.9 in adult (HCC) -     TSH -     Vitamin D 1,25 dihydroxy -     Lipid panel -     Hemoglobin A1c  Encounter for screening for lung cancer -     Ambulatory referral to Pulmonology  Tobacco use -     Ambulatory referral to Pulmonology      Problem List Items Addressed This Visit      Other   Hyperglycemia   Relevant Orders   Hemoglobin A1c   Morbid obesity ([place])   Tobacco use   Relevant Orders   Ambulatory referral to Pulmonology    Other Visit Diagnoses    Encounter for preventative adult health care exam with abnormal findings    -  Primary   Relevant Orders   PSA   Ambulatory referral to Gastroenterology   CBC with Differential/Platelet   Comprehensive metabolic panel   Influenza vaccine needed       Relevant Orders   Flu Vaccine QUAD 6+ mos PF IM (Fluarix Quad PF)   Encounter for lipid screening for cardiovascular disease       Prostate cancer screening       Relevant Orders   PSA   Positive fecal occult blood test       Relevant Orders   IFOBT POC (occult bld, rslt in office)   Fecal occult  blood, imunochemical   Colon cancer screening       Encounter for screening for lung cancer       Relevant Orders   Ambulatory referral to Pulmonology      Follow up: Return if symptoms worsen or fail to improve.  Wilfred Lacy, NP

## 2020-10-22 ENCOUNTER — Other Ambulatory Visit: Payer: Self-pay | Admitting: Nurse Practitioner

## 2020-10-22 DIAGNOSIS — E559 Vitamin D deficiency, unspecified: Secondary | ICD-10-CM

## 2020-10-22 LAB — VITAMIN D 1,25 DIHYDROXY
Vitamin D 1, 25 (OH)2 Total: 29 pg/mL (ref 18–72)
Vitamin D2 1, 25 (OH)2: <8 pg/mL
Vitamin D3 1, 25 (OH)2: 29 pg/mL

## 2020-10-22 MED ORDER — VITAMIN D (ERGOCALCIFEROL) 1.25 MG (50000 UNIT) PO CAPS: 50000.0000 [IU] | ORAL_CAPSULE | ORAL | 0 refills | Status: DC

## 2020-10-25 NOTE — Telephone Encounter (Signed)
Sig: TAKE 1 CAPSULE BY MOUTH EVERY 7 DAYS    Disp:  12 capsule

## 2020-11-02 ENCOUNTER — Other Ambulatory Visit: Payer: Self-pay

## 2020-11-02 ENCOUNTER — Other Ambulatory Visit (INDEPENDENT_AMBULATORY_CARE_PROVIDER_SITE_OTHER): Payer: PRIVATE HEALTH INSURANCE

## 2020-11-02 DIAGNOSIS — R195 Other fecal abnormalities: Secondary | ICD-10-CM

## 2020-11-04 LAB — FECAL OCCULT BLOOD, IMMUNOCHEMICAL: Fecal Occult Bld: POSITIVE — AB

## 2020-11-04 NOTE — Addendum Note (Signed)
Addended by: Leana Gamer on: 11/04/2020 03:53 PM   Modules accepted: Orders

## 2020-12-02 ENCOUNTER — Other Ambulatory Visit: Payer: Self-pay | Admitting: Nurse Practitioner

## 2020-12-02 DIAGNOSIS — E559 Vitamin D deficiency, unspecified: Secondary | ICD-10-CM

## 2020-12-07 ENCOUNTER — Ambulatory Visit: Payer: Self-pay | Admitting: Family Medicine

## 2020-12-16 NOTE — Telephone Encounter (Signed)
Per [employer], Low Vit. D: start 50000IU weekly x 6weeks, then switch to 5000IU daily from OTC.

## 2020-12-20 ENCOUNTER — Encounter: Payer: Self-pay | Admitting: Gastroenterology

## 2020-12-21 ENCOUNTER — Ambulatory Visit: Payer: PRIVATE HEALTH INSURANCE | Admitting: Gastroenterology

## 2020-12-28 ENCOUNTER — Encounter: Payer: Self-pay | Admitting: Family Medicine

## 2020-12-28 ENCOUNTER — Telehealth (INDEPENDENT_AMBULATORY_CARE_PROVIDER_SITE_OTHER): Payer: PRIVATE HEALTH INSURANCE | Admitting: Family Medicine

## 2020-12-28 VITALS — Temp 100.1°F | Wt 295.0 lb

## 2020-12-28 DIAGNOSIS — U071 COVID-19: Secondary | ICD-10-CM | POA: Diagnosis not present

## 2020-12-28 MED ORDER — BENZONATATE 100 MG PO CAPS: 100.0000 mg | ORAL_CAPSULE | Freq: Two times a day (BID) | ORAL | 0 refills | Status: DC | PRN

## 2020-12-28 NOTE — Progress Notes (Signed)
[facility] [address] [place] [address] Dept: [phone] Dept Fax: [phone]  Telephone Visit  I connected with [name] on 12/28/20 at  4:00 PM EST by telephone and verified that I am speaking with the correct person using two identifiers. Telephone was utilized as the [employer] phone would not connect to video.  Location patient: Home Location provider: Clinic Persons participating in the virtual visit: Patient, Provider  I discussed the limitations of evaluation and management by telemedicine and the availability of in person appointments. The patient expressed understanding and agreed to proceed.  Chief Complaint  Patient presents with  . Acute Visit    C/o chills, fever, ST, chest congestion, cough x 2 days. He has taken Robitussin with little relief.  Home covid test was positve.      SUBJECTIVE:  HPI: [name] is a 55 y.o. male who presents with onset of an itchy throat on 12/24/2020. By the next day, he had a sore throat, HA, fatigue, and chills. On 1/23, he performed a home COVID test, which was negative. The next day, he continued to have chills and cough. His home test was positive that day. He went to have a lab COVID test performed, which is still pending at this point. His main concern is for continued cough. He denies any dyspnea. His urine has been darker over the past 2 days. He does smoke cigars (1 cigar/wk). He did receive his primary COVID vaccine series, but has not had a booster.  Past Medical History:  Diagnosis Date  . Lumbar herniated disc    Hx of  . Numbness and tingling of both legs    Hx: of  . Pneumonia    Hx of 2014    Past Surgical History:  Procedure Laterality Date  . BACK SURGERY    . KNEE ARTHROSCOPY Left 08/26/2013   Procedure: Manipulation under anesthesia left knee w/ steriiod injection and steroid injection left foot;  Surgeon: Mcarthur Rossetti, MD;   Location: [facility];  Service: Orthopedics;  Laterality: Left;  . ORIF PATELLA Left 04/26/2013   Procedure: Partial catalectomy with advancement of patella tendon;  Surgeon: Mcarthur Rossetti, MD;  Location: [facility];  Service: Orthopedics;  Laterality: Left;    Family History  Problem Relation Age of Onset  . Hypertension Mother   . Heart disease Mother 2  . Stroke Mother   . Hypertension Father   . Heart disease Father 4  . Alcohol abuse Half-Brother     Social History   Tobacco Use  . Smoking status: Current Every Day Smoker    Packs/day: 1.00    Years: 30.00    Pack years: 30.00    Types: Cigars  . Smokeless tobacco: Never Used  Substance Use Topics  . Alcohol use: No  . Drug use: No    Current Outpatient Medications:  .  benzonatate (TESSALON) 100 MG capsule, Take 1 capsule (100 mg total) by mouth 2 (two) times daily as needed for cough., Disp: 20 capsule, Rfl: 0 .  Cholecalciferol (VITAMIN D3) 50 MCG (2000 UT) TABS, Take by mouth., Disp: , Rfl:  .  hydrochlorothiazide (HYDRODIURIL) 25 MG tablet, Take 1 tablet (25 mg total) by mouth daily. (Patient not taking: No sig reported), Disp: 30 tablet, Rfl: 0 .  Vitamin D, Ergocalciferol, (DRISDOL) 1.25 MG (50000 UNIT) CAPS capsule, Take 1 capsule (50,000 Units total) by mouth every 7 (seven) days. (Patient not taking:  Reported on 12/28/2020), Disp: 6 capsule, Rfl: 0  Allergies  Allergen Reactions  . Penicillins Hives and Itching    States he had PCN recently and tolerated it well   ROS: See pertinent positives and negatives per HPI.  ASSESSMENT AND PLAN:  1. COVID-19 Based on subjective assessment, [name] has mild COVID-19 infection. We discussed the natural history of COVID. I recommended he continue to monitor for worsening respiratory symptoms over the next 5-7 days. We discussed the current CDC recommendation for self-isolation for 5 days after symptom onset, with continued mask use around others for an additional 5  days. This is all predicated on improvement in symptoms.  - benzonatate (TESSALON) 100 MG capsule; Take 1 capsule (100 mg total) by mouth 2 (two) times daily as needed for cough.  Dispense: 20 capsule; Refill: 0  I discussed the assessment and treatment plan with the patient. The patient was provided an opportunity to ask questions and all were answered. The patient agreed with the plan and demonstrated an understanding of the instructions.   The patient was advised to call back or seek an in-person evaluation if the symptoms worsen or if the condition fails to improve as anticipated.  I spent 20 minutes on this telephone encounter.  Haydee Salter, MD

## 2021-01-19 ENCOUNTER — Ambulatory Visit (INDEPENDENT_AMBULATORY_CARE_PROVIDER_SITE_OTHER): Payer: PRIVATE HEALTH INSURANCE | Admitting: Gastroenterology

## 2021-01-19 ENCOUNTER — Other Ambulatory Visit: Payer: Self-pay

## 2021-01-19 ENCOUNTER — Encounter: Payer: Self-pay | Admitting: Gastroenterology

## 2021-01-19 VITALS — BP 120/62 | HR 88 | Ht 74.0 in | Wt 295.8 lb

## 2021-01-19 DIAGNOSIS — K219 Gastro-esophageal reflux disease without esophagitis: Secondary | ICD-10-CM

## 2021-01-19 DIAGNOSIS — K625 Hemorrhage of anus and rectum: Secondary | ICD-10-CM

## 2021-01-19 DIAGNOSIS — R195 Other fecal abnormalities: Secondary | ICD-10-CM | POA: Diagnosis not present

## 2021-01-19 DIAGNOSIS — K6289 Other specified diseases of anus and rectum: Secondary | ICD-10-CM

## 2021-01-19 MED ORDER — SUTAB 1479-225-188 MG PO TABS: 1.0000 | ORAL_TABLET | Freq: Once | ORAL | 0 refills | Status: AC

## 2021-01-19 NOTE — Progress Notes (Signed)
01/19/2021 [name] [id number] 04-Jun-1966   HISTORY OF PRESENT ILLNESS: This is a pleasant 55 year old male with very limited past medical history.  He presents here today at the request of his PCP, Flossie Buffy, NP, for evaluation regarding positive fecal immunochemistry testing.  He has never had colonoscopy in the past.  He does admit to intermittent bright red rectal bleeding and rectal pain as well that he attributes to hemorrhoids.  He says that they tend to bother him on and off, but sometimes will bother him for couple of weeks at a time.  He says that he moves his bowels regularly.  He does admit to some left-sided abdominal discomfort that has been present since about 2017.  He had a left rectus hematoma after having pneumonia and having a coughing fit.  He says that since that time he has had pain along the left side of his abdomen.  He also reports some heartburn and reflux with belching.  Does not take any medication regularly for those issues.    Recent labs including CBC, CMP, TSH, hemoglobin A1c were all unremarkable.  He is actually in the middle of switching jobs and his insurance and on Friday.   Past Medical History:  Diagnosis Date  . COVID-19   . Lumbar herniated disc    Hx of  . Numbness and tingling of both legs    Hx: of  . Pneumonia    Hx of 2014   Past Surgical History:  Procedure Laterality Date  . BACK SURGERY    . KNEE ARTHROSCOPY Left 08/26/2013   Procedure: Manipulation under anesthesia left knee w/ steriiod injection and steroid injection left foot;  Surgeon: Mcarthur Rossetti, MD;  Location: [facility];  Service: Orthopedics;  Laterality: Left;  . ORIF PATELLA Left 04/26/2013   Procedure: Partial catalectomy with advancement of patella tendon;  Surgeon: Mcarthur Rossetti, MD;  Location: [facility];  Service: Orthopedics;  Laterality: Left;    reports that he has been smoking cigars. He has a 30.00 pack-year smoking history. He has  never used smokeless tobacco. He reports that he does not drink alcohol and does not use drugs. family history includes Alcohol abuse in his half-brother; Heart disease (age of onset: 55) in his father and mother; Hypertension in his father and mother; Stroke in his mother. Allergies  Allergen Reactions  . Penicillins Hives and Itching    States he had PCN recently and tolerated it well      Outpatient Encounter Medications as of 01/19/2021  Medication Sig  . [DISCONTINUED] benzonatate (TESSALON) 100 MG capsule Take 1 capsule (100 mg total) by mouth 2 (two) times daily as needed for cough.  . [DISCONTINUED] Cholecalciferol (VITAMIN D3) 50 MCG (2000 UT) TABS Take by mouth.  . [DISCONTINUED] hydrochlorothiazide (HYDRODIURIL) 25 MG tablet Take 1 tablet (25 mg total) by mouth daily. (Patient not taking: No sig reported)  . [DISCONTINUED] Vitamin D, Ergocalciferol, (DRISDOL) 1.25 MG (50000 UNIT) CAPS capsule Take 1 capsule (50,000 Units total) by mouth every 7 (seven) days. (Patient not taking: Reported on 12/28/2020)   No facility-administered encounter medications on file as of 01/19/2021.     REVIEW OF SYSTEMS  : All other systems reviewed and negative except where noted in the History of Present Illness.   PHYSICAL EXAM: BP 120/62   Pulse 88   Ht 6\' 2"  (1.88 m)   Wt 295 lb 12.8 oz (134.2 kg)   BMI 37.98 kg/m  General: Well developed white male in no acute distress Head: Normocephalic and atraumatic Eyes:  Sclerae anicteric, conjunctiva pink. Ears: Normal auditory acuity Lungs: Clear throughout to auscultation; no W/R/R. Heart: Regular rate and rhythm; no M/R/G. Abdomen: Soft, non-distended.  BS present.  Mild left sided TTP. Rectal:  Will be done at the time of colonoscopy. Musculoskeletal: Symmetrical with no gross deformities  Skin: No lesions on visible extremities Extremities: No edema  Neurological: Alert oriented x 4, grossly non-focal Psychological:  Alert and  cooperative. Normal mood and affect  ASSESSMENT AND PLAN: *Positive fecal immunochemistry testing: Does admit to intermittent rectal bleeding, bright red blood, and pain that he attributes to hemorrhoids.  Never had colonoscopy in the past.   *GERD: Intermittent with belching.  Not currently on any medication.  **He is actually losing his insurance after Friday for a period of a few months while he is trying to switch jobs.  Fortunately Dr. Henrene Pastor had some openings for colonoscopy and EGD on Friday.  We will schedule patient with Dr. Henrene Pastor.  The risks, benefits, and alternatives to EGD and colonoscopy were discussed with the patient and he consents to proceed.  **We will prescribe hydrocortisone cream to apply twice daily as needed for rectal discomfort.  Prescription sent to pharmacy.   CC:  Nche, Charlene Brooke, NP

## 2021-01-19 NOTE — Patient Instructions (Signed)
If you are age 56 or older, your body mass index should be between 23-30. Your Body mass index is 37.98 kg/m. If this is out of the aforementioned range listed, please consider follow up with your Primary Care Provider.  If you are age 63 or younger, your body mass index should be between 19-25. Your Body mass index is 37.98 kg/m. If this is out of the aformentioned range listed, please consider follow up with your Primary Care Provider.   We have sent the following medications to your pharmacy for you to pick up at your convenience: Hydrocortisone 2.5% cream twice daily as needed.  You have been scheduled for an endoscopy and colonoscopy. Please follow the written instructions given to you at your visit today. Please pick up your prep supplies at the pharmacy within the next 1-3 days. If you use inhalers (even only as needed), please bring them with you on the day of your procedure.  Due to recent changes in healthcare laws, you may see the results of your imaging and laboratory studies on MyChart before your provider has had a chance to review them.  We understand that in some cases there may be results that are confusing or concerning to you. Not all laboratory results come back in the same time frame and the provider may be waiting for multiple results in order to interpret others.  Please give [place] 48 hours in order for your provider to thoroughly review all the results before contacting the office for clarification of your results.   Thank you for choosing me and [facility].  Alonza Bogus, PA-C

## 2021-01-19 NOTE — Progress Notes (Signed)
Assessment and plan reviewed 

## 2021-01-21 ENCOUNTER — Ambulatory Visit (AMBULATORY_SURGERY_CENTER): Payer: PRIVATE HEALTH INSURANCE | Admitting: Internal Medicine

## 2021-01-21 ENCOUNTER — Other Ambulatory Visit: Payer: Self-pay

## 2021-01-21 ENCOUNTER — Telehealth: Payer: Self-pay | Admitting: Internal Medicine

## 2021-01-21 ENCOUNTER — Encounter: Payer: Self-pay | Admitting: Internal Medicine

## 2021-01-21 VITALS — BP 141/71 | HR 64 | Temp 96.9°F | Resp 14 | Ht 74.0 in | Wt 295.0 lb

## 2021-01-21 DIAGNOSIS — K2970 Gastritis, unspecified, without bleeding: Secondary | ICD-10-CM

## 2021-01-21 DIAGNOSIS — K648 Other hemorrhoids: Secondary | ICD-10-CM | POA: Diagnosis not present

## 2021-01-21 DIAGNOSIS — K5520 Angiodysplasia of colon without hemorrhage: Secondary | ICD-10-CM | POA: Diagnosis not present

## 2021-01-21 DIAGNOSIS — K219 Gastro-esophageal reflux disease without esophagitis: Secondary | ICD-10-CM

## 2021-01-21 DIAGNOSIS — K635 Polyp of colon: Secondary | ICD-10-CM | POA: Diagnosis not present

## 2021-01-21 DIAGNOSIS — K253 Acute gastric ulcer without hemorrhage or perforation: Secondary | ICD-10-CM

## 2021-01-21 DIAGNOSIS — R195 Other fecal abnormalities: Secondary | ICD-10-CM | POA: Diagnosis present

## 2021-01-21 DIAGNOSIS — D124 Benign neoplasm of descending colon: Secondary | ICD-10-CM

## 2021-01-21 DIAGNOSIS — K2950 Unspecified chronic gastritis without bleeding: Secondary | ICD-10-CM

## 2021-01-21 DIAGNOSIS — K625 Hemorrhage of anus and rectum: Secondary | ICD-10-CM

## 2021-01-21 MED ORDER — SODIUM CHLORIDE 0.9 % IV SOLN: 500.0000 mL | Freq: Once | INTRAVENOUS | Status: DC

## 2021-01-21 MED ORDER — PANTOPRAZOLE SODIUM 40 MG PO TBEC: 40.0000 mg | DELAYED_RELEASE_TABLET | Freq: Every day | ORAL | 11 refills | Status: AC

## 2021-01-21 NOTE — Progress Notes (Signed)
Schedule not available yet, recall put in for three months.

## 2021-01-21 NOTE — Patient Instructions (Signed)
Avoid unnecessary NSAIDS ( medicines like ibuprofen, advil and aleve).  Office will call you to reschedule colonoscopy in three months.    YOU HAD AN ENDOSCOPIC PROCEDURE TODAY AT [facility]:   Refer to the procedure report that was given to you for any specific questions about what was found during the examination.  If the procedure report does not answer your questions, please call your gastroenterologist to clarify.  If you requested that your care partner not be given the details of your procedure findings, then the procedure report has been included in a sealed envelope for you to review at your convenience later.  YOU SHOULD EXPECT: Some feelings of bloating in the abdomen. Passage of more gas than usual.  Walking can help get rid of the air that was put into your GI tract during the procedure and reduce the bloating. If you had a lower endoscopy (such as a colonoscopy or flexible sigmoidoscopy) you may notice spotting of blood in your stool or on the toilet paper. If you underwent a bowel prep for your procedure, you may not have a normal bowel movement for a few days.  Please Note:  You might notice some irritation and congestion in your nose or some drainage.  This is from the oxygen used during your procedure.  There is no need for concern and it should clear up in a day or so.  SYMPTOMS TO REPORT IMMEDIATELY:   Following lower endoscopy (colonoscopy or flexible sigmoidoscopy):  Excessive amounts of blood in the stool  Significant tenderness or worsening of abdominal pains  Swelling of the abdomen that is new, acute  Fever of 100F or higher   Following upper endoscopy (EGD)  Vomiting of blood or coffee ground material  New chest pain or pain under the shoulder blades  Painful or persistently difficult swallowing  New shortness of breath  Fever of 100F or higher  Black, tarry-looking stools  For urgent or emergent issues, a gastroenterologist can be reached at  any hour by calling [phone]. Do not use MyChart messaging for urgent concerns.    DIET:  We do recommend a small meal at first, but then you may proceed to your regular diet.  Drink plenty of fluids but you should avoid alcoholic beverages for 24 hours.  ACTIVITY:  You should plan to take it easy for the rest of today and you should NOT DRIVE or use heavy machinery until tomorrow (because of the sedation medicines used during the test).    FOLLOW UP: Our staff will call the number listed on your records 48-72 hours following your procedure to check on you and address any questions or concerns that you may have regarding the information given to you following your procedure. If we do not reach you, we will leave a message.  We will attempt to reach you two times.  During this call, we will ask if you have developed any symptoms of COVID 19. If you develop any symptoms (ie: fever, flu-like symptoms, shortness of breath, cough etc.) before then, please call [phone].  If you test positive for Covid 19 in the 2 weeks post procedure, please call and report this information to [place].    If any biopsies were taken you will be contacted by phone or by letter within the next 1-3 weeks.  Please call us at [phone] if you have not heard about the biopsies in 3 weeks.    SIGNATURES/CONFIDENTIALITY: You and/or your care partner have  signed paperwork which will be entered into your electronic medical record.  These signatures attest to the fact that that the information above on your After Visit Summary has been reviewed and is understood.  Full responsibility of the confidentiality of this discharge information lies with you and/or your care-partner.

## 2021-01-21 NOTE — Progress Notes (Signed)
PT taken to PACU. Monitors in place. VSS. Report given to RN. 

## 2021-01-21 NOTE — Op Note (Signed)
[facility] Patient Name: [name] Procedure Date: 01/21/2021 8:58 AM MRN: [id number] Endoscopist: Docia Chuck. Henrene Pastor , MD Age: 55 Referring MD:  Date of Birth: 10-16-1966 Gender: Male Account #: [id number] Procedure:                Colonoscopy with cold snare polypectomy x 1 Indications:              Positive fecal immunochemical test Medicines:                Monitored Anesthesia Care Procedure:                Pre-Anesthesia Assessment:                           - Prior to the procedure, a History and Physical                            was performed, and patient medications and                            allergies were reviewed. The patient's tolerance of                            previous anesthesia was also reviewed. The risks                            and benefits of the procedure and the sedation                            options and risks were discussed with the patient.                            All questions were answered, and informed consent                            was obtained. Prior Anticoagulants: The patient has                            taken no previous anticoagulant or antiplatelet                            agents. ASA Grade Assessment: II - A patient with                            mild systemic disease. After reviewing the risks                            and benefits, the patient was deemed in                            satisfactory condition to undergo the procedure.                           After obtaining informed consent, the colonoscope  was passed under direct vision. Throughout the                            procedure, the patient's blood pressure, pulse, and                            oxygen saturations were monitored continuously. The                            Colonoscope was introduced through the anus and                            advanced to the the cecum, identified by                             appendiceal orifice and ileocecal valve. The                            ileocecal valve, appendiceal orifice, and rectum                            were photographed. The quality of the bowel                            preparation was poor. The colonoscopy was performed                            without difficulty. The patient tolerated the                            procedure well. The bowel preparation used was                            SUPREP via split dose instruction. Scope In: 9:18:38 AM Scope Out: 9:30:42 AM Scope Withdrawal Time: 0 hours 8 minutes 55 seconds  Total Procedure Duration: 0 hours 12 minutes 4 seconds  Findings:                 A 4 mm polyp was found in the descending colon. The                            polyp was removed with a cold snare. Resection and                            retrieval were complete.                           A single small angiodysplastic lesion without                            bleeding was found in the cecum.                           Internal hemorrhoids were found during  retroflexion. The hemorrhoids were moderate.                           The exam was otherwise without abnormality on                            direct and retroflexion views, with significant                            prep limitations. Complications:            No immediate complications. Estimated blood loss:                            None. Estimated Blood Loss:     Estimated blood loss: none. Impression:               - Preparation of the colon was poor.                           - One 4 mm polyp in the descending colon, removed                            with a cold snare. Resected and retrieved.                           - A single non-bleeding colonic angiodysplastic                            lesion.                           - Internal hemorrhoids.                           - The examination was otherwise normal on direct                             and retroflexion views, with significant prep                            limitations. Recommendation:           - Repeat colonoscopy within 3 months due to poor                            preparation. Recommend more extensive preparation                            with magnesium citrate the morning prior to the                            examination to be followed by standard split prep,                            but NOT SUtabs.                           -  Patient has a contact number available for                            emergencies. The signs and symptoms of potential                            delayed complications were discussed with the                            patient. Return to normal activities tomorrow.                            Written discharge instructions were provided to the                            patient.                           - Resume previous diet.                           - Continue present medications.                           - Await pathology results. Docia Chuck. Henrene Pastor, MD 01/21/2021 9:45:14 AM This report has been signed electronically.

## 2021-01-21 NOTE — Progress Notes (Signed)
Pt's states no medical or surgical changes since previsit or office visit.  VS SB

## 2021-01-21 NOTE — Progress Notes (Signed)
Called to room to assist during endoscopic procedure.  Patient ID and intended procedure confirmed with present staff. Received instructions for my participation in the procedure from the performing physician.  

## 2021-01-21 NOTE — Telephone Encounter (Signed)
Pt states he has not received his cream to help with his hemorrhoids, pt would like to know if this could be called in ASAP.  [name]

## 2021-01-21 NOTE — Op Note (Signed)
[facility] Patient Name: [name] Procedure Date: 01/21/2021 8:58 AM MRN: [id number] Endoscopist: Docia Chuck. Henrene Pastor , MD Age: 55 Referring MD:  Date of Birth: 1966/03/19 Gender: Male Account #: [id number] Procedure:                Upper GI endoscopy with biopsies Indications:              Esophageal reflux, Heme positive stool Medicines:                Monitored Anesthesia Care Procedure:                Pre-Anesthesia Assessment:                           - Prior to the procedure, a History and Physical                            was performed, and patient medications and                            allergies were reviewed. The patient's tolerance of                            previous anesthesia was also reviewed. The risks                            and benefits of the procedure and the sedation                            options and risks were discussed with the patient.                            All questions were answered, and informed consent                            was obtained. Prior Anticoagulants: The patient has                            taken no previous anticoagulant or antiplatelet                            agents. ASA Grade Assessment: II - A patient with                            mild systemic disease. After reviewing the risks                            and benefits, the patient was deemed in                            satisfactory condition to undergo the procedure.                           After obtaining informed consent, the endoscope was  passed under direct vision. Throughout the                            procedure, the patient's blood pressure, pulse, and                            oxygen saturations were monitored continuously. The                            Endoscope was introduced through the mouth, and                            advanced to the second part of duodenum. The upper                            GI  endoscopy was accomplished without difficulty.                            The patient tolerated the procedure well. Scope In: Scope Out: Findings:                 The esophagus was normal.                           The stomach revealed diffuse superficial ulceration                            of the distal body and proximal antrum. Biopsies                            were taken with a cold forceps for histology. A                            small hiatal hernia was present                           The examined duodenum was normal.                           The cardia and gastric fundus were normal on                            retroflexion. Complications:            No immediate complications. Estimated Blood Loss:     Estimated blood loss: none. Impression:               1. Ulcerative gastritis                           2. GERD with normal esophageal mucosa                           3. Otherwise normal EGD. Recommendation:           1. Prescribe pantoprazole 40 mg daily; #30; 11  refills                           2. Avoid unnecessary NSAIDs (medicines like                            ibuprofen, Advil, Aleve) if you are using                           3. Follow-up biopsies                           4. Office follow-up with Dr. Henrene Pastor in about 6 weeks. Docia Chuck. Henrene Pastor, MD 01/21/2021 9:51:08 AM This report has been signed electronically.

## 2021-01-24 MED ORDER — HYDROCORTISONE (PERIANAL) 2.5 % EX CREA: 1.0000 "application " | TOPICAL_CREAM | Freq: Two times a day (BID) | CUTANEOUS | 1 refills | Status: AC

## 2021-01-24 NOTE — Telephone Encounter (Signed)
Hydrocortisone cream sent to [facility]

## 2021-01-25 ENCOUNTER — Telehealth: Payer: Self-pay

## 2021-01-25 ENCOUNTER — Telehealth: Payer: Self-pay | Admitting: *Deleted

## 2021-01-25 NOTE — Telephone Encounter (Signed)
  Follow up Call-  Call back number 01/21/2021  Post procedure Call Back phone  # [phone]  Permission to leave phone message Yes  Some recent data might be hidden     Patient questions:  Do you have a fever, pain , or abdominal swelling? No. Pain Score  0 *  Have you tolerated food without any problems? Yes.    Have you been able to return to your normal activities? Yes.    Do you have any questions about your discharge instructions: Diet   No. Medications  No. Follow up visit  No.  Do you have questions or concerns about your Care? No.  Actions: * If pain score is 4 or above: No action needed, pain <4.  1. Have you developed a fever since your procedure? no  2.   Have you had an respiratory symptoms (SOB or cough) since your procedure? no  3.   Have you tested positive for COVID 19 since your procedure no  4.   Have you had any family members/close contacts diagnosed with the COVID 19 since your procedure?  no   If yes to any of these questions please route to Joylene John, RN and Joella Prince, RN

## 2021-01-25 NOTE — Telephone Encounter (Signed)
First attempt follow up call to pt, vm not set up, unable to leave message.

## 2021-01-27 ENCOUNTER — Ambulatory Visit: Payer: PRIVATE HEALTH INSURANCE | Admitting: Gastroenterology

## 2021-01-28 ENCOUNTER — Encounter: Payer: Self-pay | Admitting: Internal Medicine

## 2021-01-31 ENCOUNTER — Other Ambulatory Visit: Payer: Self-pay

## 2021-01-31 MED ORDER — BIS SUBCIT-METRONID-TETRACYC 140-125-125 MG PO CAPS: 3.0000 | ORAL_CAPSULE | Freq: Three times a day (TID) | ORAL | 0 refills | Status: AC

## 2021-01-31 MED ORDER — PANTOPRAZOLE SODIUM 40 MG PO TBEC: 40.0000 mg | DELAYED_RELEASE_TABLET | Freq: Two times a day (BID) | ORAL | 0 refills | Status: DC

## 2021-02-03 ENCOUNTER — Telehealth: Payer: Self-pay | Admitting: Internal Medicine

## 2021-02-03 NOTE — Telephone Encounter (Signed)
Spoke with patient to clarify his documented Penicillin allergy - I have a sample of Talicia but it contains Amoxicillin.  He states he has no problem taking Amoxicillin.  Is it ok to give him Talicia for his H Pylori?

## 2021-02-03 NOTE — Telephone Encounter (Signed)
Spoke with patient who clarified he has no trouble with Amoxicillin and has, in fact, taken Penicillin as an adult with no problems.  I told him I would leave the Talicia sample up front with dosing instructions for him to pick up.  Patient agreed.

## 2021-02-03 NOTE — Telephone Encounter (Signed)
Only if he understands that amoxicillin is a penicillin derivative and that he is 997% CERTAIN that he has had amoxicillin tolerated without problems.  Thanks

## 2021-02-03 NOTE — Telephone Encounter (Signed)
Pt is requesting a cheaper alternative for his Pylera.  Nice

## 2021-03-08 ENCOUNTER — Ambulatory Visit: Payer: Self-pay | Admitting: Internal Medicine

## 2021-03-08 ENCOUNTER — Other Ambulatory Visit: Payer: Self-pay

## 2021-03-08 ENCOUNTER — Encounter: Payer: Self-pay | Admitting: Internal Medicine

## 2021-03-08 VITALS — BP 142/66 | HR 80 | Ht 74.0 in | Wt 302.4 lb

## 2021-03-08 DIAGNOSIS — K219 Gastro-esophageal reflux disease without esophagitis: Secondary | ICD-10-CM

## 2021-03-08 DIAGNOSIS — K253 Acute gastric ulcer without hemorrhage or perforation: Secondary | ICD-10-CM

## 2021-03-08 DIAGNOSIS — R195 Other fecal abnormalities: Secondary | ICD-10-CM

## 2021-03-08 DIAGNOSIS — M7918 Myalgia, other site: Secondary | ICD-10-CM

## 2021-03-08 DIAGNOSIS — K649 Unspecified hemorrhoids: Secondary | ICD-10-CM

## 2021-03-08 DIAGNOSIS — A048 Other specified bacterial intestinal infections: Secondary | ICD-10-CM

## 2021-03-08 MED ORDER — HYDROCORTISONE (PERIANAL) 2.5 % EX CREA: 1.0000 "application " | TOPICAL_CREAM | Freq: Every day | CUTANEOUS | 1 refills | Status: AC

## 2021-03-08 NOTE — Progress Notes (Signed)
HISTORY OF PRESENT ILLNESS:  [name] is a 55 y.o. male who was evaluated as a new patient by the GI physician assistant in this office on January 19, 2021 regarding positive FIT testing.  At that time he reported problems with chronic GERD.  He was scheduled for colonoscopy and upper endoscopy January 21, 2021.  Colonoscopy revealed a cecal AVM, diminutive hyperplastic polyp which was removed, and internal hemorrhoids.  The examination was compromised by poor prep for which follow-up was in 3 months recommended.  Upper endoscopy revealed erosive gastritis.  Biopsies revealed chronic active gastritis with Helicobacter pylori.  No intestinal metaplasia or dysplasia.  He was treated with a 2-week course of Pylera.  He continues on pantoprazole use on demand.  This helps.  Patient tells me that his health insurance has expired from his previous job.  His current job without offering health coverage.  His only complaint today is chronic left-sided discomfort that he notices with certain movements and exertion.  He did have significant pain in that area 5 years ago after intractable issues with coughing.  He was found to have a large rectus hematoma.  Patient does say that his hemorrhoids bother him periodically.  Previously called in hemorrhoid cream was said to not have been received by the pharmacy (though the EMR documents that it was called in).  In any event, he does request something for his hemorrhoids if needed.  He has completed his COVID vaccination series  REVIEW OF SYSTEMS:  All non-GI ROS negative unless otherwise stated in the HPI except for excessive urination, urinary frequency, back pain, night sweats  Past Medical History:  Diagnosis Date  . COVID-19   . Lumbar herniated disc    Hx of  . Numbness and tingling of both legs    Hx: of  . Pneumonia    Hx of 2014    Past Surgical History:  Procedure Laterality Date  . BACK SURGERY    . KNEE ARTHROSCOPY Left 08/26/2013    Procedure: Manipulation under anesthesia left knee w/ steriiod injection and steroid injection left foot;  Surgeon: Mcarthur Rossetti, MD;  Location: [facility];  Service: Orthopedics;  Laterality: Left;  . ORIF PATELLA Left 04/26/2013   Procedure: Partial catalectomy with advancement of patella tendon;  Surgeon: Mcarthur Rossetti, MD;  Location: [place];  Service: Orthopedics;  Laterality: Left;    Social History [name]  reports that he has been smoking cigars. He has a 30.00 pack-year smoking history. He has never used smokeless tobacco. He reports that he does not drink alcohol and does not use drugs.  family history includes Alcohol abuse in his half-brother; Heart disease (age of onset: 4) in his father and mother; Hypertension in his father and mother; Stroke in his mother.  Allergies  Allergen Reactions  . Penicillins Hives and Itching    States he had PCN recently and tolerated it well       PHYSICAL EXAMINATION: Vital signs: BP (!) 142/66   Pulse 80   Ht 6\' 2"  (1.88 m)   Wt (!) 302 lb 6.4 oz (137.2 kg)   BMI 38.83 kg/m   Constitutional: generally well-appearing, no acute distress Psychiatric: alert and oriented x3, cooperative Eyes: extraocular movements intact, anicteric, conjunctiva pink Mouth: oral pharynx moist, no lesions Neck: supple no lymphadenopathy Cardiovascular: heart regular rate and rhythm, no murmur Lungs: clear to auscultation bilaterally Abdomen: soft, mild tenderness over the left lateral abdominal wall with palpation, nondistended, no  obvious ascites, no peritoneal signs, normal bowel sounds, no organomegaly Rectal: Omitted Extremities: no clubbing, cyanosis.  Trace lower extremity edema bilaterally Skin: no lesions on visible extremities Neuro: No focal deficits.  Cranial nerves intact  ASSESSMENT:  1.  Positive FIT testing status post colonoscopy and upper endoscopy 2.  Colonoscopy compromised by poor preparation 3.  Cecal AVM.  Could  be responsible for occult blood 4.  Erosive gastritis associated with Helicobacter pylori. 5.  GERD 6.  Intermittent discomfort and bleeding 7.  Musculoskeletal pain.  Chronic.  Stable   PLAN:  1.  Reflux precautions 2.  Weight loss 3.  To pantoprazole or other PPI to control GERD symptoms 4.  Metamucil 1 to 2 tablespoons daily 5.  Prescribed Anusol HC suppositories (or the equivalent) 6.  Patient understands importance of repeat colonoscopy given his poor preparation.  He will contact the office as soon as his finances (health insurance) allow scheduling.  He will require more extensive preparation with 2 days of clear liquids, magnesium citrate the morning prior to the procedure, followed by standard split prep (not tablets).

## 2021-03-08 NOTE — Patient Instructions (Signed)
Take 1-2 tablespoons of Metamucil daily  We have sent the following medications to your pharmacy for you to pick up at your convenience:  Anusol HC cream  Purchase Preparation H suppositories over the counter.  Put a small amount of cream on the suppository and insert rectally

## 2021-04-01 ENCOUNTER — Encounter: Payer: Self-pay | Admitting: Internal Medicine

## 2021-04-14 ENCOUNTER — Other Ambulatory Visit: Payer: Self-pay

## 2021-04-14 ENCOUNTER — Ambulatory Visit: Payer: Self-pay

## 2022-12-03 ENCOUNTER — Emergency Department (HOSPITAL_BASED_OUTPATIENT_CLINIC_OR_DEPARTMENT_OTHER)
Admission: EM | Admit: 2022-12-03 | Discharge: 2022-12-03 | Disposition: A | Discharge status: Home / Self Care | Payer: BLUE CROSS/BLUE SHIELD | Attending: Emergency Medicine | Admitting: Emergency Medicine

## 2022-12-03 ENCOUNTER — Emergency Department (HOSPITAL_BASED_OUTPATIENT_CLINIC_OR_DEPARTMENT_OTHER): Payer: BLUE CROSS/BLUE SHIELD

## 2022-12-03 ENCOUNTER — Encounter (HOSPITAL_BASED_OUTPATIENT_CLINIC_OR_DEPARTMENT_OTHER): Payer: Self-pay

## 2022-12-03 DIAGNOSIS — W19XXXA Unspecified fall, initial encounter: Secondary | ICD-10-CM

## 2022-12-03 DIAGNOSIS — W11XXXA Fall on and from ladder, initial encounter: Secondary | ICD-10-CM | POA: Diagnosis not present

## 2022-12-03 DIAGNOSIS — S32010A Wedge compression fracture of first lumbar vertebra, initial encounter for closed fracture: Secondary | ICD-10-CM | POA: Insufficient documentation

## 2022-12-03 DIAGNOSIS — S3992XA Unspecified injury of lower back, initial encounter: Secondary | ICD-10-CM | POA: Diagnosis present

## 2022-12-03 MED ORDER — OXYCODONE-ACETAMINOPHEN 5-325 MG PO TABS: 2.0000 | ORAL_TABLET | ORAL | 0 refills | Status: DC | PRN

## 2022-12-03 MED ORDER — LIDOCAINE 5 % EX PTCH
2.0000 | MEDICATED_PATCH | CUTANEOUS | Status: DC
  Administered 2022-12-03: 2 via TRANSDERMAL
  Filled 2022-12-03 (×2): qty 2

## 2022-12-03 MED ORDER — KETOROLAC TROMETHAMINE 60 MG/2ML IM SOLN
30.0000 mg | Freq: Once | INTRAMUSCULAR | Status: AC
  Administered 2022-12-03: 30 mg via INTRAMUSCULAR
  Filled 2022-12-03: qty 2

## 2022-12-03 MED ORDER — OXYCODONE-ACETAMINOPHEN 5-325 MG PO TABS
1.0000 | ORAL_TABLET | Freq: Once | ORAL | Status: AC
  Administered 2022-12-03: 1 via ORAL
  Filled 2022-12-03: qty 1

## 2022-12-03 MED ORDER — METHOCARBAMOL 500 MG PO TABS
500.0000 mg | ORAL_TABLET | Freq: Once | ORAL | Status: AC
  Administered 2022-12-03: 500 mg via ORAL
  Filled 2022-12-03: qty 1

## 2022-12-03 MED ORDER — HYDROMORPHONE HCL 1 MG/ML IJ SOLN
1.0000 mg | Freq: Once | INTRAMUSCULAR | Status: AC
  Administered 2022-12-03: 1 mg via INTRAMUSCULAR
  Filled 2022-12-03: qty 1

## 2022-12-03 MED ORDER — METHOCARBAMOL 500 MG PO TABS: 500.0000 mg | ORAL_TABLET | Freq: Two times a day (BID) | ORAL | 0 refills | Status: DC

## 2022-12-03 MED ORDER — OXYCODONE-ACETAMINOPHEN 5-325 MG PO TABS
2.0000 | ORAL_TABLET | Freq: Once | ORAL | Status: AC
  Administered 2022-12-03: 2 via ORAL
  Filled 2022-12-03: qty 2

## 2022-12-03 NOTE — ED Provider Notes (Signed)
[facility] EMERGENCY DEPARTMENT Provider Note   CSN: [id number] Arrival date & time: 12/03/22  1646     History  Chief Complaint  Patient presents with   [name] is a 56 y.o. male.  56 year old male who fell 3 to 4 feet off a ladder landing flat on his back.  Patient states he had back surgeries in the past and has severe pain worse than it has been since his back surgeries.  Patient states that he has paresthesias in the left side of his back in the paraspinal area but not down his leg.  No incontinence.  No weakness in his legs.  No sensation changes in the perineum.  No other associated symptoms.   Fall       Home Medications Prior to Admission medications   Medication Sig Start Date End Date Taking? Authorizing Provider  methocarbamol (ROBAXIN) 500 MG tablet Take 1 tablet (500 mg total) by mouth 2 (two) times daily. 12/03/22  Yes Rasheeda Mulvehill, Corene Cornea, MD  oxyCODONE-acetaminophen (PERCOCET) 5-325 MG tablet Take 2 tablets by mouth every 4 (four) hours as needed. 12/03/22  Yes Creed Kail, Corene Cornea, MD  bismuth-metronidazole-tetracycline [facility]) [phone] MG capsule Take 3 capsules by mouth 4 (four) times daily -  before meals and at bedtime. 01/31/21   Irene Shipper, MD  hydrocortisone (ANUSOL-HC) 2.5 % rectal cream Place 1 application rectally 2 (two) times daily. Patient not taking: Reported on 03/08/2021 01/24/21   Zehr, Laban Emperor, PA-C  hydrocortisone (ANUSOL-HC) 2.5 % rectal cream Place 1 application rectally at bedtime. 03/08/21   Irene Shipper, MD  pantoprazole (PROTONIX) 40 MG tablet Take 1 tablet (40 mg total) by mouth daily. 01/21/21   Irene Shipper, MD      Allergies    Penicillins    Review of Systems   Review of Systems  Physical Exam Updated Vital Signs BP (!) 150/74   Pulse 75   Temp 98.1 F (36.7 C) (Oral)   Resp 16   Ht '6\' 3"'$  (1.905 m)   Wt 136.1 kg   SpO2 94%   BMI 37.50 kg/m  Physical Exam Vitals and nursing note reviewed.   Constitutional:      Appearance: He is well-developed.  HENT:     Head: Normocephalic and atraumatic.  Eyes:     Pupils: Pupils are equal, round, and reactive to light.  Cardiovascular:     Rate and Rhythm: Normal rate.  Pulmonary:     Effort: Pulmonary effort is normal. No respiratory distress.  Abdominal:     General: There is no distension.  Genitourinary:    Comments: Normal perineal sensation Musculoskeletal:        General: Tenderness (left lower thoracic/upper lumbar paraspinal) present. Normal range of motion.     Cervical back: Normal range of motion.  Skin:    General: Skin is warm and dry.  Neurological:     General: No focal deficit present.     Mental Status: He is alert.     Sensory: No sensory deficit.     Motor: No weakness.     ED Results / Procedures / Treatments   Labs (all labs ordered are listed, but only abnormal results are displayed) Labs Reviewed - No data to display  EKG None  Radiology CT Thoracic Spine Wo Contrast  Result Date: 12/03/2022 CLINICAL DATA:  Fall from ladder EXAM: CT THORACIC AND LUMBAR SPINE WITHOUT CONTRAST TECHNIQUE: Multidetector CT imaging of the thoracic and  lumbar spine was performed without contrast. Multiplanar CT image reconstructions were also generated. RADIATION DOSE REDUCTION: This exam was performed according to the departmental dose-optimization program which includes automated exposure control, adjustment of the mA and/or kV according to patient size and/or use of iterative reconstruction technique. COMPARISON:  None Available. FINDINGS: CT THORACIC SPINE FINDINGS Alignment: Normal. Vertebrae: No acute fracture or focal pathologic process. Paraspinal and other soft tissues: Calcific aortic atherosclerosis. Disc levels: No thoracic spinal canal stenosis. CT LUMBAR SPINE FINDINGS Segmentation: 5 lumbar type vertebrae. Alignment: Normal. Vertebrae: Wedge compression fracture of L1 with less than 25% height loss.  Paraspinal and other soft tissues: Calcific aortic atherosclerosis. Disc levels: No spinal canal or neural foraminal stenosis. IMPRESSION: 1. Wedge compression fracture of L1 with less than 25% height loss. (AO Spine L1:A1) 2. No acute fracture or static subluxation of the thoracic spine. Aortic Atherosclerosis (ICD10-I70.0). Electronically Signed   By: Ulyses Jarred M.D.   On: 12/03/2022 19:40   CT Lumbar Spine Wo Contrast  Result Date: 12/03/2022 CLINICAL DATA:  Fall from ladder EXAM: CT THORACIC AND LUMBAR SPINE WITHOUT CONTRAST TECHNIQUE: Multidetector CT imaging of the thoracic and lumbar spine was performed without contrast. Multiplanar CT image reconstructions were also generated. RADIATION DOSE REDUCTION: This exam was performed according to the departmental dose-optimization program which includes automated exposure control, adjustment of the mA and/or kV according to patient size and/or use of iterative reconstruction technique. COMPARISON:  None Available. FINDINGS: CT THORACIC SPINE FINDINGS Alignment: Normal. Vertebrae: No acute fracture or focal pathologic process. Paraspinal and other soft tissues: Calcific aortic atherosclerosis. Disc levels: No thoracic spinal canal stenosis. CT LUMBAR SPINE FINDINGS Segmentation: 5 lumbar type vertebrae. Alignment: Normal. Vertebrae: Wedge compression fracture of L1 with less than 25% height loss. Paraspinal and other soft tissues: Calcific aortic atherosclerosis. Disc levels: No spinal canal or neural foraminal stenosis. IMPRESSION: 1. Wedge compression fracture of L1 with less than 25% height loss. (AO Spine L1:A1) 2. No acute fracture or static subluxation of the thoracic spine. Aortic Atherosclerosis (ICD10-I70.0). Electronically Signed   By: Ulyses Jarred M.D.   On: 12/03/2022 19:40    Procedures Procedures    Medications Ordered in ED Medications  lidocaine (LIDODERM) 5 % 2 patch (2 patches Transdermal Patch Applied 12/03/22 1853)  HYDROmorphone  (DILAUDID) injection 1 mg (1 mg Intramuscular Given 12/03/22 1756)  ketorolac (TORADOL) injection 30 mg (30 mg Intramuscular Given 12/03/22 1756)  oxyCODONE-acetaminophen (PERCOCET/ROXICET) 5-325 MG per tablet 2 tablet (2 tablets Oral Given 12/03/22 1959)  methocarbamol (ROBAXIN) tablet 500 mg (500 mg Oral Given 12/03/22 2002)  oxyCODONE-acetaminophen (PERCOCET/ROXICET) 5-325 MG per tablet 1 tablet (1 tablet Oral Given 12/03/22 2213)    ED Course/ Medical Decision Making/ A&P                           Medical Decision Making Amount and/or Complexity of Data Reviewed Radiology: ordered.  Risk Prescription drug management.  Will treat symptoms and ct for injuries. No e/o cord compression at this time.   Does have a wedge compression fracture. TLSO applied. Pain controlled. No e/o cauda equina.   Will follow up with his spine surgeon.    Final Clinical Impression(s) / ED Diagnoses Final diagnoses:  Fall, initial encounter  Compression fracture of L1 vertebra, initial encounter ([place])    Rx / DC Orders ED Discharge Orders          Ordered    oxyCODONE-acetaminophen (PERCOCET) 5-325 MG  tablet  Every 4 hours PRN        12/03/22 2209    methocarbamol (ROBAXIN) 500 MG tablet  2 times daily        12/03/22 2209              Square Jowett, Corene Cornea, MD 12/03/22 2308

## 2022-12-03 NOTE — ED Notes (Signed)
TLSO rep bedside, applied brace and provided education to pt.

## 2022-12-03 NOTE — ED Triage Notes (Signed)
Fall 4 feet off of a ladder onto back with numbness to left side of back. Denies incontinence. Denies LOC/thinner use.

## 2022-12-05 ENCOUNTER — Telehealth: Payer: Self-pay

## 2022-12-05 MED ORDER — OXYCODONE-ACETAMINOPHEN 5-325 MG PO TABS: 2.0000 | ORAL_TABLET | Freq: Four times a day (QID) | ORAL | 0 refills | Status: AC | PRN

## 2022-12-05 MED ORDER — METHOCARBAMOL 500 MG PO TABS: 500.0000 mg | ORAL_TABLET | Freq: Three times a day (TID) | ORAL | 0 refills | Status: AC | PRN

## 2022-12-05 NOTE — Telephone Encounter (Signed)
Patient did not have his prescription called into the pharmacy.  We called the pharmacy to confirm that it has not been filled and checked the [place].  Plan to send this medicine to his pharmacy of choice.

## 2022-12-05 NOTE — Telephone Encounter (Signed)
Transition Care Management Unsuccessful Follow-up Telephone Call  Date of discharge and from where:  12/03/22 [facility] ED. Dx: Fall, compression fracture  Attempts:  1st Attempt  Reason for unsuccessful TCM follow-up call:  Left voice message

## 2022-12-06 NOTE — Telephone Encounter (Signed)
Transition Care Management Unsuccessful Follow-up Telephone Call  Date of discharge and from where:  12/03/22 [facility] ED. Dx: Fall, compression fracture   Attempts:  2nd Attempt  Reason for unsuccessful TCM follow-up call:  Left voice message

## 2023-01-19 ENCOUNTER — Telehealth: Payer: Self-pay | Admitting: Nurse Practitioner

## 2023-01-19 NOTE — Telephone Encounter (Signed)
Lvm to see if patient is still wanting to see [name] as PCP or if we need to take her off as PCP

## 2024-11-25 ENCOUNTER — Emergency Department (HOSPITAL_BASED_OUTPATIENT_CLINIC_OR_DEPARTMENT_OTHER)
Admission: EM | Admit: 2024-11-25 | Discharge: 2024-11-25 | Disposition: A | Discharge status: Home / Self Care | Attending: Emergency Medicine | Admitting: Emergency Medicine

## 2024-11-25 ENCOUNTER — Emergency Department (HOSPITAL_BASED_OUTPATIENT_CLINIC_OR_DEPARTMENT_OTHER)

## 2024-11-25 ENCOUNTER — Other Ambulatory Visit: Payer: Self-pay

## 2024-11-25 ENCOUNTER — Encounter (HOSPITAL_BASED_OUTPATIENT_CLINIC_OR_DEPARTMENT_OTHER): Payer: Self-pay

## 2024-11-25 DIAGNOSIS — S82031A Displaced transverse fracture of right patella, initial encounter for closed fracture: Secondary | ICD-10-CM | POA: Diagnosis not present

## 2024-11-25 DIAGNOSIS — W108XXA Fall (on) (from) other stairs and steps, initial encounter: Secondary | ICD-10-CM | POA: Insufficient documentation

## 2024-11-25 DIAGNOSIS — M25561 Pain in right knee: Secondary | ICD-10-CM | POA: Diagnosis present

## 2024-11-25 MED ORDER — MORPHINE SULFATE (PF) 4 MG/ML IV SOLN
4.0000 mg | Freq: Once | INTRAVENOUS | Status: AC
  Administered 2024-11-25: 4 mg via INTRAVENOUS
  Filled 2024-11-25: qty 1

## 2024-11-25 MED ORDER — OXYCODONE-ACETAMINOPHEN 5-325 MG PO TABS: 1.0000 | ORAL_TABLET | Freq: Four times a day (QID) | ORAL | 0 refills | Status: AC | PRN

## 2024-11-25 MED ORDER — OXYCODONE-ACETAMINOPHEN 5-325 MG PO TABS
2.0000 | ORAL_TABLET | Freq: Once | ORAL | Status: AC
  Administered 2024-11-25: 2 via ORAL
  Filled 2024-11-25: qty 2

## 2024-11-25 NOTE — ED Provider Notes (Signed)
 " [place] EMERGENCY DEPARTMENT AT [facility] Provider Note   CSN: [id number] Arrival date & time: 11/25/24  1902     Patient presents with: [name] Bethena is a 58 y.o. male.  58 year old male presents ED via EMS for complaints of mechanical fall prior to arrival.  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  Patient advises he fell down approximately 3 steps and then fell like he could not move his right leg so EMS was perceived to be called for transfer to hospital.  Patient denies hitting his head and he reports taking Plavix and aspirin  for prolonged carotid artery at 75%.  Patient denies blood thinners.  Patient denies any associated symptoms including numbness, tingling, shortness of breath, bone pain, headache, visual disturbances.  Patient was given fentanyl  prior to arrival and is requesting pain meds.     Prior to Admission medications  Medication Sig Start Date End Date Taking? Authorizing Provider  oxyCODONE -acetaminophen  (PERCOCET/ROXICET) 5-325 MG tablet Take 1 tablet by mouth every 6 (six) hours as needed for severe pain (pain score 7-10). 11/25/24  Yes Myriam Fonda RAMAN, PA-C  bismuth-metronidazole-tetracycline [facility]) 140-125-125 MG capsule Take 3 capsules by mouth 4 (four) times daily -  before meals and at bedtime. 01/31/21   Abran Norleen SAILOR, MD  hydrocortisone  (ANUSOL -HC) 2.5 % rectal cream Place 1 application rectally 2 (two) times daily. Patient not taking: Reported on 03/08/2021 01/24/21   Zehr, Jessica D, PA-C  hydrocortisone  (ANUSOL -HC) 2.5 % rectal cream Place 1 application rectally at bedtime. 03/08/21   Abran Norleen SAILOR, MD  methocarbamol  (ROBAXIN ) 500 MG tablet Take 1 tablet (500 mg total) by mouth every 8 (eight) hours as needed for muscle spasms. 12/05/22   Long, Fonda MATSU,  MD  oxyCODONE -acetaminophen  (PERCOCET) 5-325 MG tablet Take 2 tablets by mouth every 6 (six) hours as needed for severe pain. 12/05/22   Long, Fonda MATSU, MD  pantoprazole  (PROTONIX ) 40 MG tablet Take 1 tablet (40 mg total) by mouth daily. 01/21/21   Abran Norleen SAILOR, MD    Allergies: Penicillins    Review of Systems  Musculoskeletal:  Positive for joint swelling and myalgias.  Skin:  Positive for wound.  All other systems reviewed and are negative.   Updated Vital Signs BP (!) 152/82   Pulse 81   Temp 97.8 F (36.6 C) (Oral)   SpO2 98%   Physical Exam Vitals and nursing note reviewed.  Constitutional:      Appearance: Normal appearance.  HENT:     Head: Normocephalic and atraumatic.     Nose: Nose normal.  Eyes:     Extraocular Movements: Extraocular movements intact.     Conjunctiva/sclera: Conjunctivae normal.     Pupils: Pupils are equal, round, and reactive to light.  Cardiovascular:     Rate and Rhythm: Normal rate.  Pulmonary:     Effort: Pulmonary effort is normal. No respiratory distress.     Breath sounds: Normal breath sounds.  Abdominal:     General: There is no distension.     Tenderness: There is no abdominal tenderness. There is no guarding.  Musculoskeletal:        General: Swelling present.     Cervical back: Normal range of motion.     Comments: Skin abrasion  noted to the anterior portion of the right knee.  There is some notable swelling within the right knee.  Patient has full sensation, cap refill, pulses distal to the injury.  Patient is able to move ankle without difficulty.  No pain to palpation throughout hip.  Patient has full range of motion of his left thumb with no obvious dislocation or injury.  Patient denies any significant pain and has good sensation and cap refill.  Skin:    General: Skin is warm.     Capillary Refill: Capillary refill takes less than 2 seconds.  Neurological:     General: No focal deficit present.     Mental Status: He is  alert.  Psychiatric:        Mood and Affect: Mood normal.        Behavior: Behavior normal.     (all labs ordered are listed, but only abnormal results are displayed) Labs Reviewed - No data to display  EKG: None  Radiology: DG Hand Complete Left Result Date: 11/25/2024 EXAM: 3 OR MORE VIEW(S) XRAY OF THE LEFT HAND 11/25/2024 09:48:00 PM COMPARISON: None available. CLINICAL HISTORY: fall FINDINGS: BONES AND JOINTS: Widening of the scapholunate interval compatible with chronic ligamentous injury. No acute fracture. No dislocation. SOFT TISSUES: The soft tissues are unremarkable. IMPRESSION: 1. No acute fracture or dislocation. 2. Widening of the scapholunate interval compatible with chronic ligamentous injury. Electronically signed by: Norman Gatlin MD 11/25/2024 10:10 PM EST RP Workstation: [id number]   DG Wrist Complete Left Result Date: 11/25/2024 EXAM: 3 OR MORE VIEW(S) XRAY OF THE LEFT WRIST 11/25/2024 09:48:00 PM COMPARISON: None available. CLINICAL HISTORY: fall FINDINGS: BONES AND JOINTS: Widening of the scapholunate distance suggesting scapholunate ligament injury. No acute fracture or dislocation. SOFT TISSUES: The soft tissues are unremarkable. IMPRESSION: 1. No acute abnormality. 2. Widening of the scapholunate distance suggesting chronic scapholunate ligament injury. Electronically signed by: Norman Gatlin MD 11/25/2024 10:09 PM EST RP Workstation: [id number]   DG Finger Thumb Left Result Date: 11/25/2024 EXAM: 3 VIEW(S) XRAY OF THE LEFT THUMB 11/25/2024 07:33:00 PM COMPARISON: None available. CLINICAL HISTORY: tripped and fell on his left thumb trying to catch himself. Reports medial pain in the thumb. FINDINGS: BONES AND JOINTS: No acute fracture. No malalignment. Trace spurring noted at the thumb MCP and IP joints. SOFT TISSUES: There is mild generalized soft tissue swelling. IMPRESSION: 1. Soft tissue swelling without evidence of fractures. Electronically signed by: Francis Quam MD 11/25/2024 08:36 PM EST RP Workstation: [id number]   DG Knee Right Port Result Date: 11/25/2024 CLINICAL DATA:  Knee pain. EXAM: PORTABLE RIGHT KNEE - 1-2 VIEW COMPARISON:  None Available. FINDINGS: Displaced transverse fracture through the patella with approximately 17 mm distraction gap. No dislocation. The bones are osteopenic. Small suprapatellar effusion. The soft tissues are unremarkable. IMPRESSION: Displaced transverse fracture through the patella. Electronically Signed   By: Vanetta Chou M.D.   On: 11/25/2024 20:33     Procedures   Medications Ordered in the ED  oxyCODONE -acetaminophen  (PERCOCET/ROXICET) 5-325 MG per tablet 2 tablet (has no administration in time range)  morphine  (PF) 4 MG/ML injection 4 mg (4 mg Intravenous Given 11/25/24 1944)  morphine  (PF) 4 MG/ML injection 4 mg (4 mg Intravenous Given 11/25/24 2111)    58 y.o. male presents to the ED with complaints of mechanical fall with injury to right knee and left thumb,  The differential diagnosis includes but not limited to dislocation, fracture, musculoskeletal injury, tendon/ligamentous injury (Ddx)  On arrival pt  is nontoxic, vitals unremarkable. Exam significant for swelling in the right knee with skin abrasion.  I ordered medication morphine  for pain  Imaging Studies ordered:  Left thumb x-ray and portable right knee x-ray ordered in triage.  X-ray remarkable for displaced transverse fracture through the patella.,  X-ray unremarkable for any fractures.  After reevaluation left wrist and hand x-ray ordered due to increased pain.  X-rays were negative for any acute fracture or dislocation.  ED Course:   On initial evaluation patient is sitting comfortably in ED bed.  There is some significant swelling to right knee with a skin abrasion and no other significant injuries noted.  Patient reports pain is approximately 8 out of 10 in the right knee and reporting minimal pain in the left thumb.  There  was found to be a displaced transverse fracture through the patella on the right knee.  Patient was discussed with attending and was advised to put patient in knee immobilizer with crutches and have him follow-up with their orthopedic.  Patient reports his orthopedics was already talking about doing a knee replacement in his right knee in the new year.  It was discussed with the patient to follow recommendations from orthopedics on further treatment.  On reevaluation after knee immobilizer patient reported his left wrist is starting to hurt a whole lot more with some swelling.  He is requesting x-rays and the left wrist and hand at this time.  X-rays are negative for any fracture.  Patient requested more pain meds and is not driving, his wife is driving.  Patient will be given oxycodone  prior to discharge and given short course of pain management until he can follow-up with orthopedics.  Patient was given strict return cautions and was comfortable discharge at this time.  Portions of this note were generated with Scientist, clinical (histocompatibility and immunogenetics). Dictation errors may occur despite best attempts at proofreading.   Final diagnoses:  Closed displaced transverse fracture of right patella, initial encounter    ED Discharge Orders          Ordered    oxyCODONE -acetaminophen  (PERCOCET/ROXICET) 5-325 MG tablet  Every 6 hours PRN        11/25/24 2249               Myriam Fonda RAMAN, [place] 11/25/24 2347    Armenta Canning, MD 11/26/24 1530  "

## 2024-11-25 NOTE — Discharge Instructions (Addendum)
 X-rays were concerning for patellar fracture.  Please use the knee immobilizer and crutches until further advised by orthopedics.  Its important to follow-up with orthopedics as soon as possible.  Please follow-up with your established orthopedic as soon as possible.  There is a orthopedic referral attached to this if you are unable to get with your current orthopedic group.  If you have any new or worsening symptoms please return to ED for evaluation.  Symptoms would include worsening pain, signs of infection, loss of feeling or function of your leg.  I have sent you in a short course of pain medication to take as needed please also take Tylenol  and ibuprofen for pain relief.

## 2024-11-25 NOTE — ED Triage Notes (Signed)
 BIB [facility]   Tripped over extension cord, swelling noted to R knee. Also reports pain in L thumb   Given 100 mcg IV of fentanyl  by EMS

## 2024-11-26 ENCOUNTER — Telehealth (HOSPITAL_BASED_OUTPATIENT_CLINIC_OR_DEPARTMENT_OTHER): Payer: Self-pay

## 2024-11-26 NOTE — Telephone Encounter (Cosign Needed Addendum)
 Received call from [facility] and spoke with pharmacist regarding prescription from 11/25/24 for percocet from another EDP. Patient is on oxycodone  5mg  for regular pain management already, a prescription was sent in for percocet 5-325mg  and the pharmacist called wanting clarification. Last fill of oxycodone  5mg  (home medication) was 11/10/24 and the patient received a full month supply, so the patient should still have some of this medication remaining at this time. Since patient already has narcotic pain medication prescription will hold off at this time on further medication or dose increase and allow patient to follow-up with orthopedics/PCP.
